# Patient Record
Sex: Male | Born: 1955 | Race: White | Hispanic: No | Marital: Married | Smoking: Never smoker
Health system: Southern US, Community
[De-identification: ages and names within clinical notes are randomized; demographics above are authoritative.]

## PROBLEM LIST (undated history)

## (undated) DIAGNOSIS — Z91018 Allergy to other foods: Secondary | ICD-10-CM

## (undated) DIAGNOSIS — R7989 Other specified abnormal findings of blood chemistry: Secondary | ICD-10-CM

## (undated) DIAGNOSIS — K8020 Calculus of gallbladder without cholecystitis without obstruction: Secondary | ICD-10-CM

## (undated) HISTORY — DX: Allergy to other foods: Z91.018

## (undated) HISTORY — DX: Calculus of gallbladder without cholecystitis without obstruction: K80.20

## (undated) HISTORY — PX: LIPOMA EXCISION: SHX5283

---

## 2001-04-10 HISTORY — PX: COLONOSCOPY: SHX174

## 2001-08-21 ENCOUNTER — Ambulatory Visit (HOSPITAL_COMMUNITY): Admission: RE | Admit: 2001-08-21 | Discharge: 2001-08-21 | Payer: Self-pay | Admitting: Internal Medicine

## 2001-12-13 ENCOUNTER — Ambulatory Visit (HOSPITAL_COMMUNITY): Admission: RE | Admit: 2001-12-13 | Discharge: 2001-12-13 | Payer: Self-pay | Admitting: General Surgery

## 2010-03-30 ENCOUNTER — Emergency Department (HOSPITAL_COMMUNITY)
Admission: EM | Admit: 2010-03-30 | Discharge: 2010-03-30 | Payer: Self-pay | Source: Home / Self Care | Admitting: Emergency Medicine

## 2010-06-20 LAB — CBC
MCH: 33.6 pg (ref 26.0–34.0)
MCHC: 38 g/dL — ABNORMAL HIGH (ref 30.0–36.0)
Platelets: 211 10*3/uL (ref 150–400)
RDW: 14 % (ref 11.5–15.5)

## 2010-06-20 LAB — POCT CARDIAC MARKERS
CKMB, poc: <1 ng/mL — ABNORMAL LOW (ref 1.0–8.0)
Myoglobin, poc: 78.4 ng/mL (ref 12–200)
Troponin i, poc: <0.05 ng/mL (ref 0.00–0.09)

## 2010-06-20 LAB — COMPREHENSIVE METABOLIC PANEL
AST: 27 U/L (ref 0–37)
Albumin: 4.3 g/dL (ref 3.5–5.2)
Calcium: 9.4 mg/dL (ref 8.4–10.5)
Creatinine, Ser: 0.99 mg/dL (ref 0.4–1.5)
GFR calc Af Amer: >60 mL/min (ref >60)
GFR calc non Af Amer: >60 mL/min (ref >60)

## 2010-06-20 LAB — LIPASE, BLOOD: Lipase: 35 U/L (ref 11–59)

## 2010-08-26 NOTE — Op Note (Signed)
[facility]  Patient:    [name], [name] Visit Number: [id number] MRN: [id number]          Service Type: END Location: DAY Attending Physician:  Jonathon Bellows Dictated by:   Roetta Sessions, M.D. Proc. Date: 08/21/01 Admit Date:  08/21/2001   CC:         Carylon Perches, M.D.   Operative Report  PROCEDURE:  Diagnostic colonoscopy.  INDICATIONS FOR PROCEDURE:  The patient is a 55 year old gentleman with intermittent diarrhea and recent hematochezia.  He has had some cramping left lower quadrant abdominal pain.  Colonoscopy is now being done to further evaluate his symptoms. The approach has been discussed with [name]. The potential risks, benefits, and alternatives have been reviewed and questions answered.  Please see my consultation note from August 06, 2001, for more information.  PROCEDURE NOTE:  O2 saturation, blood pressure, pulse, and respirations were monitored throughout the entire procedure.  Conscious sedation, Versed 3 mg IV and Demerol 75 mg IV in divided doses.  The patient received atropine 0.5 mg IV for asymptomatic bradycardia in the 50s prior to initiation of the procedure.  FINDINGS:  Digital rectal examination revealed no abnormalities.  ENDOSCOPIC FINDINGS:  The prep was good.  RECTUM:  Examination of the rectal mucosa including the retroflexed view of the anal verge and anal canal revealed only anal canal hemorrhoids.  COLON:  The colonic mucosa was surveyed from the rectosigmoid junction through the left transverse and right colon to the area of the appendiceal orifice, ileocecal valve, and cecum.  The patient had a few scattered pancolonic diverticula.  The remainder of the colonic mucosa appeared normal.  The cecum, ileocecal valve, and appendiceal orifice were photographed for the record. The terminal ileum was intubated a good 10 cm, and this segment of the GI tract also appeared normal.  The colonic mucosa appeared  normal except for scattered pancolonic diverticula from the level of the cecum and the ileocecal valve. The scope was slowly withdrawn.  All previously mentioned mucosal surfaces were again seen.  Some residue was suctioned for microbiology studies.  The patient tolerated the procedure well.  IMPRESSION: 1. Anal canal hemorrhage or otherwise normal rectum. 2. A few scattered pancolonic diverticula.  The remainder of the colonic    mucosa appeared normal. 3. Normal terminal ileum.  Stool sample obtained. 4. I suspect that the patient will turn out to have irritable bowel syndrome    with rectal bleeding secondary to hemorrhoids.  RECOMMENDATIONS: 1. Ten-day course of Anusol-HC suppositories, one per rectum at bedtime. 2. ______ sublingual one before meals and at bedtime p.r.n. abdominal    cramps and diarrhea. 3. Followup on stool studies. 4. Diverticulosis literature. 5. Further recommendations to follow. Dictated by:   Roetta Sessions, M.D. Attending Physician:  Jonathon Bellows DD:  08/21/01 TD:  08/22/01 Job: [id number]

## 2010-08-26 NOTE — Op Note (Signed)
   NAME:  [name], [name] NO.:  [id number]   MEDICAL RECORD NO.:  [id number]                   PATIENT TYPE:  AMB   LOCATION:  DAY                                  FACILITY:  [facility]   PHYSICIAN:  Dalia Heading, M.D.               DATE OF BIRTH:  03/30/1956   DATE OF PROCEDURE:  12/13/2001  DATE OF DISCHARGE:                                 OPERATIVE REPORT   PREOPERATIVE DIAGNOSIS:  Enlarging mass, posterior scalp.   POSTOPERATIVE DIAGNOSIS:  Enlarging mass, posterior scalp, benign.   PROCEDURE:  Excision of 4 cm mass, scalp.   SURGEON:  Dalia Heading, M.D.   ANESTHESIA:  General   INDICATIONS:  The patient is a 55 year old white male who presents with an  enlarging mass along the right posterior aspect of the scalp. The risks and  benefits of the procedure including bleeding, infection, and recurrence of  the mass were fully explained to the patient, who gave informed consent and  sedation   DESCRIPTION OF PROCEDURE:  The patient was placed in the left lateral  decubitus position after general anesthesia was administered.  The hair over  the mass was shaved and the area prepped with Betadine.   A transverse incision was made over the mass.  The incision was taken down  to the subcutaneous layer.  Any bleeding was controlled using Bovie  electrocautery.  A 4-5 cm lipoma was removed.  The wound was irrigated with  normal saline.  The subcutaneous layer was reapproximated using 4-0 Vicryl  interrupted suture.  The skin was closed using a 4-0 Nylon suture.  Bacitracin ointment was then applied.   All tape and needle counts were correct at the end of the procedure.  The  patient was awakened and transferred to PACU in stable condition.   COMPLICATIONS:  None.    SPECIMEN:  Lipoma, scalp.   BLOOD LOSS:  Minimal.                                                 Dalia Heading, M.D.    MAJ/MEDQ  D:  12/13/2001  T:  12/13/2001  Job:   [id number]   cc:   [name]. [name], M.D.

## 2010-08-26 NOTE — H&P (Signed)
   NAME:  [name], [name] NO.:  [id number]   MEDICAL RECORD NO.:  [id number]                   PATIENT TYPE:   LOCATION:                                       FACILITY:   PHYSICIAN:  Dalia Heading, M.D.               DATE OF BIRTH:  Mar 03, 1956   DATE OF ADMISSION:  DATE OF DISCHARGE:                                HISTORY & PHYSICAL   CHIEF COMPLAINT:  Mass, scalp, enlarging.   HISTORY OF PRESENT ILLNESS:  The patient is a 55 year old white male who is  referred for evaluation and treatment of a mass on the scalp.  It has been  present for several years, but recently has increased in size.  It is  occasionally tender with pressure when he wears a hard hat.  No drainage has  been noted.   PAST MEDICAL HISTORY:  Spastic colon.   PAST SURGICAL HISTORY:  Unremarkable.   CURRENT MEDICATIONS:  Levsin.   ALLERGIES:  NO KNOWN DRUG ALLERGIES.   REVIEW OF SYSTEMS:  Unremarkable.   PHYSICAL EXAMINATION:  GENERAL:  The patient is a well-developed, well-  nourished white male in no acute distress.  VITAL SIGNS:  He is afebrile and vital signs are stable.  HEENT:  Reveals a 4-5 cm ovoid, subcutaneous mass noted along the posterior  scalp.  No punctum is noted.  It is slightly tender to touch.  LUNGS:  Clear to auscultation with equal breath sounds bilaterally.  HEART:  Regular rate and rhythm without S3, S4, or murmurs.   IMPRESSION:  Enlarging subcutaneous mass, scalp.   PLAN:  The patient was scheduled for excision of the mass, scalp, on  December 17, 2001.  The risks and benefits of the procedure including  bleeding, infection, and recurrence of the mass were fully explained to the  patient, who gave informed consent.                                               Dalia Heading, M.D.   MAJ/MEDQ  D:  11/19/2001  T:  11/19/2001  Job:  [id number]   cc:   [name]. [name], M.D.

## 2012-01-03 ENCOUNTER — Telehealth: Payer: Self-pay

## 2012-01-03 NOTE — Telephone Encounter (Signed)
LM for pt to call and schedule.

## 2012-01-23 NOTE — Telephone Encounter (Signed)
LMOM to call.

## 2012-01-24 NOTE — Telephone Encounter (Signed)
Letter to pt and PCP.  

## 2012-03-17 ENCOUNTER — Encounter (HOSPITAL_COMMUNITY): Payer: Self-pay | Admitting: Emergency Medicine

## 2012-03-17 ENCOUNTER — Emergency Department (HOSPITAL_COMMUNITY): Payer: 59

## 2012-03-17 ENCOUNTER — Emergency Department (HOSPITAL_COMMUNITY)
Admission: EM | Admit: 2012-03-17 | Discharge: 2012-03-17 | Disposition: A | Discharge status: Home / Self Care | Payer: 59 | Attending: Emergency Medicine | Admitting: Emergency Medicine

## 2012-03-17 ENCOUNTER — Other Ambulatory Visit: Payer: Self-pay

## 2012-03-17 DIAGNOSIS — Z79899 Other long term (current) drug therapy: Secondary | ICD-10-CM | POA: Insufficient documentation

## 2012-03-17 DIAGNOSIS — R109 Unspecified abdominal pain: Secondary | ICD-10-CM | POA: Insufficient documentation

## 2012-03-17 DIAGNOSIS — E291 Testicular hypofunction: Secondary | ICD-10-CM | POA: Insufficient documentation

## 2012-03-17 HISTORY — DX: Other specified abnormal findings of blood chemistry: R79.89

## 2012-03-17 LAB — COMPREHENSIVE METABOLIC PANEL
ALT: 43 U/L (ref 0–53)
BUN: 24 mg/dL — ABNORMAL HIGH (ref 6–23)
CO2: 29 mEq/L (ref 19–32)
Calcium: 10 mg/dL (ref 8.4–10.5)
Chloride: 101 mEq/L (ref 96–112)
Creatinine, Ser: 0.97 mg/dL (ref 0.50–1.35)
GFR calc Af Amer: >90 mL/min (ref >90)
GFR calc non Af Amer: >90 mL/min (ref >90)
Potassium: 4.5 mEq/L (ref 3.5–5.1)

## 2012-03-17 LAB — CBC WITH DIFFERENTIAL/PLATELET
Basophils Relative: 0 % (ref 0–1)
Lymphocytes Relative: 20 % (ref 12–46)
MCV: 90.5 fL (ref 78.0–100.0)
Monocytes Relative: 8 % (ref 3–12)
Neutrophils Relative %: 70 % (ref 43–77)
Platelets: 208 10*3/uL (ref 150–400)
RBC: 5.03 MIL/uL (ref 4.22–5.81)

## 2012-03-17 LAB — TROPONIN I: Troponin I: <0.3 ng/mL (ref <0.30)

## 2012-03-17 LAB — LIPASE, BLOOD: Lipase: 46 U/L (ref 11–59)

## 2012-03-17 MED ORDER — MORPHINE SULFATE 4 MG/ML IJ SOLN
4.0000 mg | Freq: Once | INTRAMUSCULAR | Status: AC
  Administered 2012-03-17: 4 mg via INTRAVENOUS
  Filled 2012-03-17: qty 1

## 2012-03-17 MED ORDER — PANTOPRAZOLE SODIUM 20 MG PO TBEC: 20.0000 mg | DELAYED_RELEASE_TABLET | Freq: Every day | ORAL | Status: DC

## 2012-03-17 MED ORDER — GI COCKTAIL ~~LOC~~
30.0000 mL | Freq: Once | ORAL | Status: AC
  Administered 2012-03-17: 30 mL via ORAL
  Filled 2012-03-17: qty 30

## 2012-03-17 MED ORDER — ONDANSETRON HCL 4 MG/2ML IJ SOLN
4.0000 mg | Freq: Once | INTRAMUSCULAR | Status: AC
  Administered 2012-03-17: 4 mg via INTRAVENOUS
  Filled 2012-03-17: qty 2

## 2012-03-17 MED ORDER — PANTOPRAZOLE SODIUM 40 MG IV SOLR
40.0000 mg | Freq: Once | INTRAVENOUS | Status: AC
  Administered 2012-03-17: 40 mg via INTRAVENOUS
  Filled 2012-03-17: qty 40

## 2012-03-17 MED ORDER — LORAZEPAM 2 MG/ML IJ SOLN
1.0000 mg | Freq: Once | INTRAMUSCULAR | Status: AC
  Administered 2012-03-17: 1 mg via INTRAVENOUS
  Filled 2012-03-17: qty 1

## 2012-03-17 MED ORDER — HYDROMORPHONE HCL PF 1 MG/ML IJ SOLN
1.0000 mg | Freq: Once | INTRAMUSCULAR | Status: AC
  Administered 2012-03-17: 1 mg via INTRAVENOUS
  Filled 2012-03-17: qty 1

## 2012-03-17 MED ORDER — IOHEXOL 350 MG/ML SOLN
120.0000 mL | Freq: Once | INTRAVENOUS | Status: AC | PRN
  Administered 2012-03-17: 120 mL via INTRAVENOUS

## 2012-03-17 MED ORDER — HYDROCODONE-ACETAMINOPHEN 5-325 MG PO TABS: 1.0000 | ORAL_TABLET | Freq: Four times a day (QID) | ORAL | Status: DC | PRN

## 2012-03-17 NOTE — ED Provider Notes (Signed)
History     CSN: [id number]  Arrival date & time 03/17/12  0051   First MD Initiated Contact with Patient 03/17/12 0102      Chief Complaint  Patient presents with  . Chest Pain    (Consider location/radiation/quality/duration/timing/severity/associated sxs/prior treatment) Patient is a 56 y.o. male presenting with chest pain. The history is provided by the patient (the pt complains of upper abd pain). No language interpreter was used.  Chest Pain The chest pain began 3 - 5 hours ago. Chest pain occurs constantly. The chest pain is unchanged. Associated with: nothing. At its most intense, the pain is at 6/10. The pain is currently at 4/10. The severity of the pain is moderate. The quality of the pain is described as aching. Pertinent negatives for primary symptoms include no fatigue, no cough and no abdominal pain.  Pertinent negatives for past medical history include no seizures.     Past Medical History  Diagnosis Date  . Low testosterone     Past Surgical History  Procedure Date  . Lipoma excision     History reviewed. No pertinent family history.  History  Substance Use Topics  . Smoking status: Never Smoker   . Smokeless tobacco: Not on file  . Alcohol Use: No      Review of Systems  Constitutional: Negative for fatigue.  HENT: Negative for congestion, sinus pressure and ear discharge.   Eyes: Negative for discharge.  Respiratory: Negative for cough.   Cardiovascular: Positive for chest pain.  Gastrointestinal: Negative for abdominal pain and diarrhea.  Genitourinary: Negative for frequency and hematuria.  Musculoskeletal: Negative for back pain.  Skin: Negative for rash.  Neurological: Negative for seizures and headaches.  Hematological: Negative.   Psychiatric/Behavioral: Negative for hallucinations.    Allergies  Beef-derived products  Home Medications   Current Outpatient Rx  Name  Route  Sig  Dispense  Refill  . FLUTICASONE PROPIONATE 50  MCG/ACT NA SUSP   Nasal   Place 2 sprays into the nose daily.         . TESTOSTERONE 50 MG/5GM TD GEL   Transdermal   Place 5 g onto the skin daily.         Marland Kitchen HYDROCODONE-ACETAMINOPHEN 5-325 MG PO TABS   Oral   Take 1 tablet by mouth every 6 (six) hours as needed for pain.   20 tablet   0   . PANTOPRAZOLE SODIUM 20 MG PO TBEC   Oral   Take 1 tablet (20 mg total) by mouth daily.   30 tablet   0     BP 154/89  Pulse 59  Temp 98.4 F (36.9 C) (Oral)  Resp 20  Ht 5\' 11"  (1.803 m)  Wt 250 lb (113.399 kg)  BMI 34.87 kg/m2  SpO2 100%  Physical Exam  Constitutional: He is oriented to person, place, and time. He appears well-developed.  HENT:  Head: Normocephalic and atraumatic.  Eyes: Conjunctivae normal and EOM are normal. No scleral icterus.  Neck: Neck supple. No thyromegaly present.  Cardiovascular: Normal rate and regular rhythm.  Exam reveals no gallop and no friction rub.   No murmur heard. Pulmonary/Chest: No stridor. He has no wheezes. He has no rales. He exhibits no tenderness.  Abdominal: He exhibits no distension. There is tenderness. There is no rebound.  Musculoskeletal: Normal range of motion. He exhibits no edema.  Lymphadenopathy:    He has no cervical adenopathy.  Neurological: He is oriented to person, place, and time.  Coordination normal.  Skin: No rash noted. No erythema.  Psychiatric: He has a normal mood and affect. His behavior is normal.    ED Course  Procedures (including critical care time)  Labs Reviewed  COMPREHENSIVE METABOLIC PANEL - Abnormal; Notable for the following:    Glucose, Bld 149 (*)     BUN 24 (*)     Total Bilirubin 1.4 (*)     All other components within normal limits  CBC WITH DIFFERENTIAL  LIPASE, BLOOD  TROPONIN I  TROPONIN I   Ct Angio Chest Pe W/cm &/or Wo Cm  03/17/2012  *RADIOLOGY REPORT*  Clinical Data:  Chest pain, shortness of breath.  Question thoracic aneurysm.  CT CHEST, ABDOMEN AND PELVIS WITH  CONTRAST  Technique:  Multidetector CT imaging of the chest, abdomen and pelvis was performed following the standard protocol during bolus administration of intravenous contrast.  Contrast: OMNIPAQUE IOHEXOL 350 MG/ML SOLN  Comparison:   None.  CT CHEST  Findings:  Mild cardiomegaly.  Aorta is normal caliber.  No dissection.  No filling defects in the pulmonary arteries to suggest pulmonary emboli. No mediastinal, hilar, or axillary adenopathy.  Lungs are clear.  No focal airspace opacities or suspicious nodules.  No effusions. Visualized thyroid and chest wall soft tissues unremarkable.  IMPRESSION: No acute findings.  CT ABDOMEN AND PELVIS  Findings:  Aorta is normal caliber.  No dissection.  Mesenteric vessels and single renal arteries are widely patent.  Small layering gallstones in the gallbladder.  Liver, spleen, stomach, pancreas, adrenals and kidneys are normal.  Moderate descending colonic and sigmoid diverticulosis. Scattered right colonic and transverse colonic diverticula.  No active diverticulitis.  Urinary bladder and prostate grossly unremarkable. Small bowel is decompressed.  No free fluid, free air or adenopathy.  No acute bony abnormality.  IMPRESSION: No evidence of aortic aneurysm or dissection.  Cholelithiasis.   Original Report Authenticated By: Charlett Nose, M.D.    Dg Chest Portable 1 View  03/17/2012  *RADIOLOGY REPORT*  Clinical Data: Chest pain.  Vomiting.  PORTABLE CHEST - 1 VIEW  Comparison: 03/30/2010.  Findings: The cardiac silhouette remains mildly enlarged.  Clear lungs with normal vascularity.  Unremarkable bones.  IMPRESSION: Stable mild cardiomegaly.  No acute abnormality.   Original Report Authenticated By: Beckie Salts, M.D.    Ct Angio Abd/pel W/ And/or W/o  03/17/2012  *RADIOLOGY REPORT*  Clinical Data:  Chest pain, shortness of breath.  Question thoracic aneurysm.  CT CHEST, ABDOMEN AND PELVIS WITH CONTRAST  Technique:  Multidetector CT imaging of the chest, abdomen  and pelvis was performed following the standard protocol during bolus administration of intravenous contrast.  Contrast: OMNIPAQUE IOHEXOL 350 MG/ML SOLN  Comparison:   None.  CT CHEST  Findings:  Mild cardiomegaly.  Aorta is normal caliber.  No dissection.  No filling defects in the pulmonary arteries to suggest pulmonary emboli. No mediastinal, hilar, or axillary adenopathy.  Lungs are clear.  No focal airspace opacities or suspicious nodules.  No effusions. Visualized thyroid and chest wall soft tissues unremarkable.  IMPRESSION: No acute findings.  CT ABDOMEN AND PELVIS  Findings:  Aorta is normal caliber.  No dissection.  Mesenteric vessels and single renal arteries are widely patent.  Small layering gallstones in the gallbladder.  Liver, spleen, stomach, pancreas, adrenals and kidneys are normal.  Moderate descending colonic and sigmoid diverticulosis. Scattered right colonic and transverse colonic diverticula.  No active diverticulitis.  Urinary bladder and prostate grossly unremarkable. Small bowel is  decompressed.  No free fluid, free air or adenopathy.  No acute bony abnormality.  IMPRESSION: No evidence of aortic aneurysm or dissection.  Cholelithiasis.   Original Report Authenticated By: Charlett Nose, M.D.      1. Abdominal pain      Date: 03/17/2012  Rate: 60  Rhythm: normal sinus rhythm  QRS Axis: normal  Intervals: normal  ST/T Wave abnormalities: normal  Conduction Disutrbances:none  Narrative Interpretation:   Old EKG Reviewed: none available    MDM          Benny Lennert, MD 03/17/12 [phone]

## 2012-03-17 NOTE — ED Notes (Signed)
Patient complaining of worsening chest pain. Advised MD.

## 2012-03-17 NOTE — ED Notes (Signed)
Patient complaining of chest pain since 2130 yesterday worsening through the night. States "I broke out in a sweat and started vomiting."

## 2013-09-15 IMAGING — CT CT CTA ABD/PEL W/CM AND/OR W/O CM
2 of 7 series · 5 of 46 positions shown, 10 images · IV contrast (omnipaque)
Comparison: None.

CT CHEST

CLINICAL DATA: Chest pain, shortness of breath.  Question thoracic
aneurysm.

CT CHEST, ABDOMEN AND PELVIS WITH CONTRAST
TECHNIQUE: Multidetector CT imaging of the chest, abdomen and
pelvis was performed following the standard protocol during bolus
administration of intravenous contrast.
Contrast: 120mL OMNIPAQUE IOHEXOL 350 MG/ML SOLN

[Series 6: dissection 3.0 b40f · axial · 0.84mm/px · z∈[-528,-200]mm · 3 of 219 slices shown, 7 images]
[im 55/219  soft-tissue]
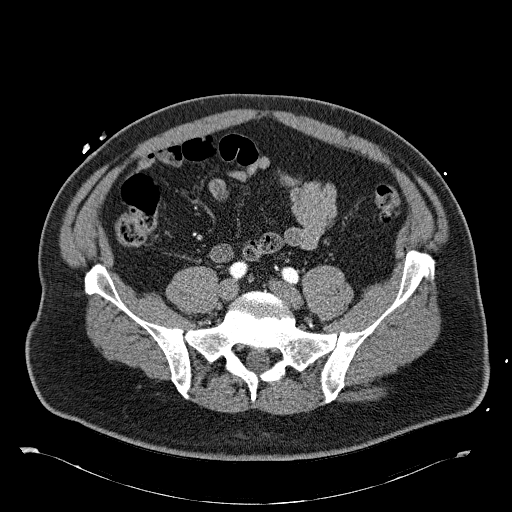
[im 55/219  lung]
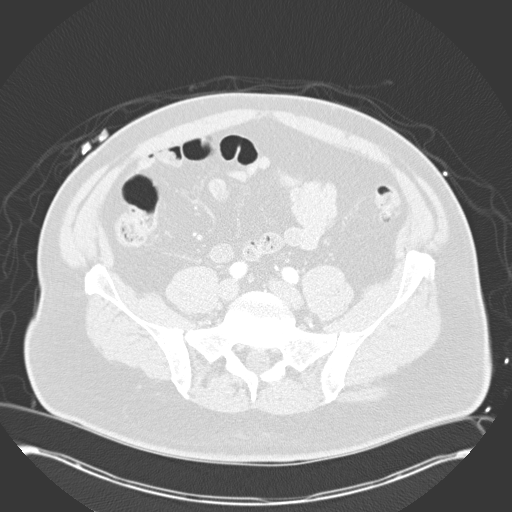
[im 55/219  bone]
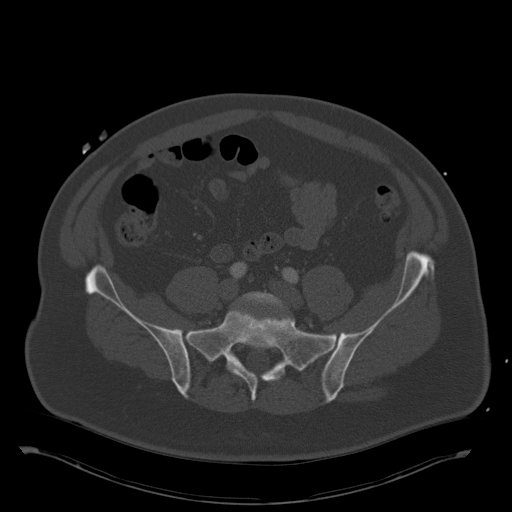
[im 110/219  soft-tissue]
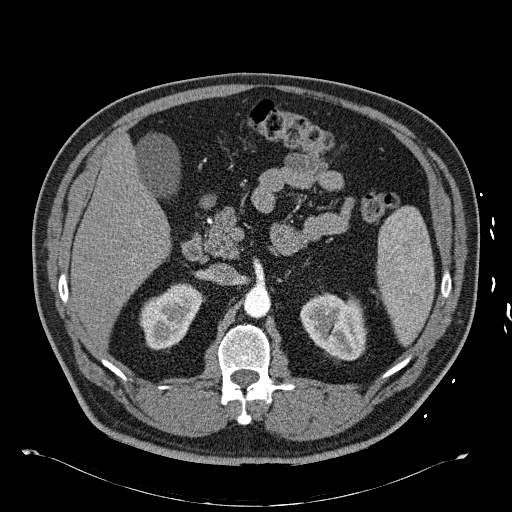
[im 110/219  lung]
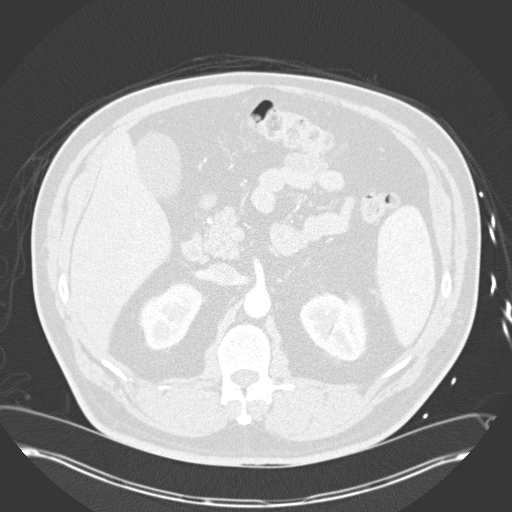
[im 164/219  soft-tissue]
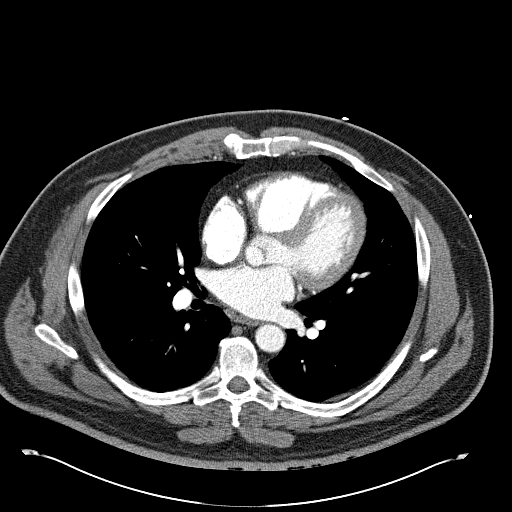
[im 164/219  lung]
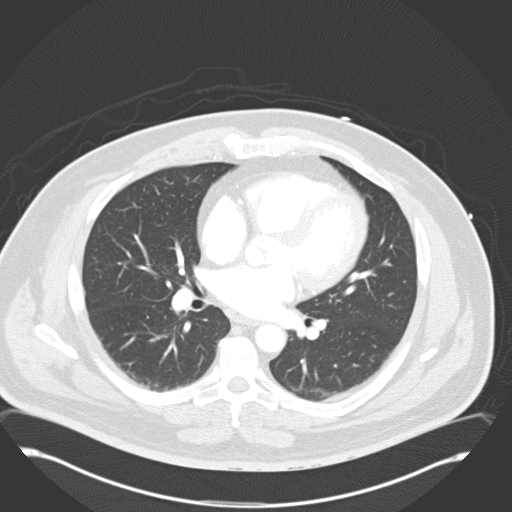

[Series 8: mpr cor post contrast · coronal · 0.84mm/px · 2 of 124 slices shown, 3 images]
[im 42/124  soft-tissue]
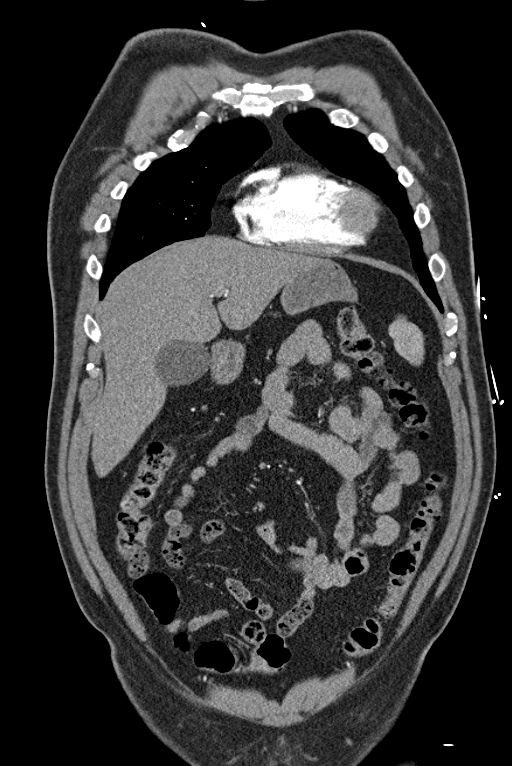
[im 42/124  bone]
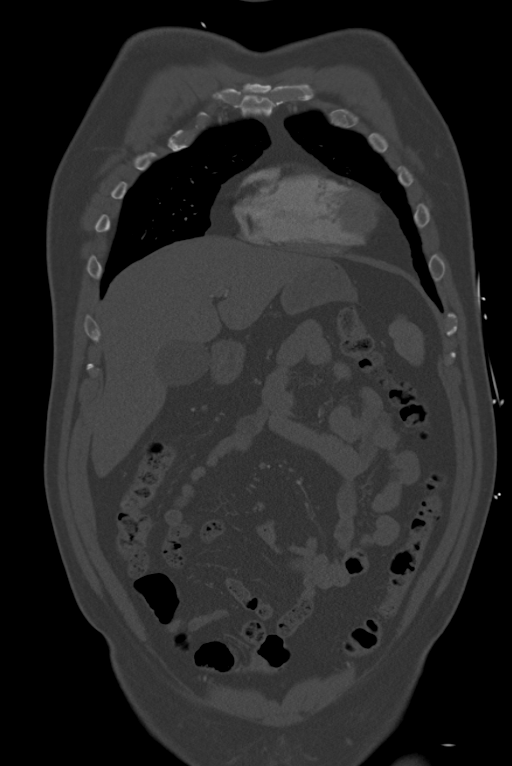
[im 83/124  soft-tissue]
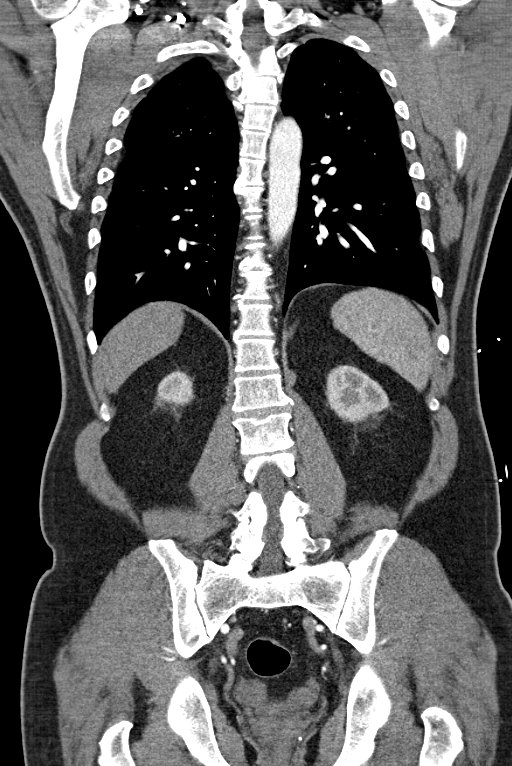

[5 of 46 positions shown; findings below may reference images not displayed]

FINDINGS: Mild cardiomegaly.  Aorta is normal caliber.  No
dissection.  No filling defects in the pulmonary arteries to
suggest pulmonary emboli. No mediastinal, hilar, or axillary
adenopathy.  Lungs are clear.  No focal airspace opacities or
suspicious nodules.  No effusions. Visualized thyroid and chest
wall soft tissues unremarkable.
IMPRESSION: No acute findings.

CT ABDOMEN AND PELVIS
FINDINGS: Aorta is normal caliber.  No dissection.  Mesenteric
vessels and single renal arteries are widely patent.

Small layering gallstones in the gallbladder.  Liver, spleen,
stomach, pancreas, adrenals and kidneys are normal.  Moderate
descending colonic and sigmoid diverticulosis. Scattered right
colonic and transverse colonic diverticula.  No active
diverticulitis.  Urinary bladder and prostate grossly unremarkable.
Small bowel is decompressed.  No free fluid, free air or
adenopathy.

No acute bony abnormality.
IMPRESSION: No evidence of aortic aneurysm or dissection.

Cholelithiasis.

## 2019-10-09 ENCOUNTER — Encounter: Payer: Self-pay | Admitting: Internal Medicine

## 2019-12-04 ENCOUNTER — Encounter: Payer: Self-pay | Admitting: Gastroenterology

## 2019-12-04 ENCOUNTER — Ambulatory Visit (INDEPENDENT_AMBULATORY_CARE_PROVIDER_SITE_OTHER): Payer: 59 | Admitting: Gastroenterology

## 2019-12-04 ENCOUNTER — Other Ambulatory Visit: Payer: Self-pay

## 2019-12-04 DIAGNOSIS — Z1211 Encounter for screening for malignant neoplasm of colon: Secondary | ICD-10-CM

## 2019-12-04 NOTE — Patient Instructions (Addendum)
We are arranging a colonoscopy in the near future with Dr. Jena Gauss.  Continue to eat a high fiber diet as you are doing. I have included a handout for this as well.  Please call if any recurrent pain!  It was a pleasure to see you today. I want to create trusting relationships with patients to provide genuine, compassionate, and quality care. I value your feedback. If you receive a survey regarding your visit,  I greatly appreciate you taking time to fill this out.   Gelene Mink, PhD, ANP-BC [facility]    Diverticulosis  Diverticulosis is a condition that develops when small pouches (diverticula) form in the wall of the large intestine (colon). The colon is where water is absorbed and stool (feces) is formed. The pouches form when the inside layer of the colon pushes through weak spots in the outer layers of the colon. You may have a few pouches or many of them. The pouches usually do not cause problems unless they become inflamed or infected. When this happens, the condition is called diverticulitis. What are the causes? The cause of this condition is not known. What increases the risk? The following factors may make you more likely to develop this condition:  Being older than age 38. Your risk for this condition increases with age. Diverticulosis is rare among people younger than age 10. By age 33, many people have it.  Eating a low-fiber diet.  Having frequent constipation.  Being overweight.  Not getting enough exercise.  Smoking.  Taking over-the-counter pain medicines, like aspirin and ibuprofen.  Having a family history of diverticulosis. What are the signs or symptoms? In most people, there are no symptoms of this condition. If you do have symptoms, they may include:  Bloating.  Cramps in the abdomen.  Constipation or diarrhea.  Pain in the lower left side of the abdomen. How is this diagnosed? Because diverticulosis usually has no symptoms, it  is most often diagnosed during an exam for other colon problems. The condition may be diagnosed by:  Using a flexible scope to examine the colon (colonoscopy).  Taking an X-ray of the colon after dye has been put into the colon (barium enema).  Having a CT scan. How is this treated? You may not need treatment for this condition. Your health care provider may recommend treatment to prevent problems. You may need treatment if you have symptoms or if you previously had diverticulitis. Treatment may include:  Eating a high-fiber diet.  Taking a fiber supplement.  Taking a live bacteria supplement (probiotic).  Taking medicine to relax your colon. Follow these instructions at home: Medicines  Take over-the-counter and prescription medicines only as told by your health care provider.  If told by your health care provider, take a fiber supplement or probiotic. Constipation prevention Your condition may cause constipation. To prevent or treat constipation, you may need to:  Drink enough fluid to keep your urine pale yellow.  Take over-the-counter or prescription medicines.  Eat foods that are high in fiber, such as beans, whole grains, and fresh fruits and vegetables.  Limit foods that are high in fat and processed sugars, such as fried or sweet foods.  General instructions  Try not to strain when you have a bowel movement.  Keep all follow-up visits as told by your health care provider. This is important. Contact a health care provider if you:  Have pain in your abdomen.  Have bloating.  Have cramps.  Have not had a  bowel movement in 3 days. Get help right away if:  Your pain gets worse.  Your bloating becomes very bad.  You have a fever or chills, and your symptoms suddenly get worse.  You vomit.  You have bowel movements that are bloody or black.  You have bleeding from your rectum. Summary  Diverticulosis is a condition that develops when small pouches  (diverticula) form in the wall of the large intestine (colon).  You may have a few pouches or many of them.  This condition is most often diagnosed during an exam for other colon problems.  Treatment may include increasing the fiber in your diet, taking supplements, or taking medicines. This information is not intended to replace advice given to you by your health care provider. Make sure you discuss any questions you have with your health care provider. Document Revised: 10/24/2018 Document Reviewed: 10/24/2018 Elsevier Patient Education  2020 [employer].

## 2019-12-04 NOTE — Progress Notes (Signed)
Cc'ed to pcp °

## 2019-12-04 NOTE — Progress Notes (Signed)
Primary Care Physician:  Carylon Perches, MD  Referring Physician: Dr. [name]  Primary Gastroenterologist:  Dr. Jena Gauss   Chief Complaint  Patient presents with  . Colonoscopy    hx lower left sided pain in Mar and June    HPI:   [name] is a 64 y.o. male presenting today at the request of Dr. [name] due to history of abdominal pain and also due for screening colonoscopy. He reports a colonoscopy in 2003 by Dr. Jena Gauss; procedure notes not available, but patient is an excellent historian and reports diverticulosis but no polyps.   Had LLQ abdominal pain intensely for one day in March, then tapered off a few days later. June happened again. Had eaten salsa the night before the June visit. Quit eating tomato-based salsa. A few other times a few years ago had a slight pressure in lower abdomen. Cut caffeine out. Changing diet. No further discomfort.   BM twice per day, no constipation. Feels productive. No rectal bleeding. No weight loss or lack of appetite. No GERD symptoms.   Processed foods, fast foods "set him off". Will have diarrhea if eating these items, so he avoids them.   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 Eats lots of vegetables.   Past Medical History:  Diagnosis Date  . 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 Cholelithiasis   . Low testosterone     Past Surgical History:  Procedure Laterality Date  . COLONOSCOPY  2003   Dr. Jena Gauss: unable to find procedure notes as was in 2003. ?diverticula  . LIPOMA EXCISION      Current Outpatient Medications  Medication Sig Dispense Refill  . EPINEPHrine (EPIPEN 2-PAK) 0.3 mg/0.3 mL IJ SOAJ injection Inject 0.3 mg into the muscle as needed for anaphylaxis.    . fluticasone (FLONASE) 50 MCG/ACT nasal spray Place 2 sprays into the nose daily.    . meloxicam (MOBIC) 15 MG tablet Take 15 mg by mouth as needed for pain. Takes 1/2 tablet as needed.     No current facility-administered medications for this visit.     Allergies as of 12/04/2019 - Review Complete 12/04/2019  Allergen Reaction Noted  . Beef-derived products  03/17/2012  . Pork-derived products  12/04/2019    Family History  Problem Relation Age of Onset  . Colon cancer Neg Hx   . Colon polyps Neg Hx     Social History   Socioeconomic History  . Marital status: Married    Spouse name: Not on file  . Number of children: Not on file  . Years of education: Not on file  . Highest education level: Not on file  Occupational History  . Not on file  Tobacco Use  . Smoking status: Never Smoker  . Smokeless tobacco: Never Used  Substance and Sexual Activity  . Alcohol use: No  . Drug use: No  . Sexual activity: Not on file  Other Topics Concern  . Not on file  Social History Narrative  . Not on file   Social Determinants of Health   Financial Resource Strain:   . Difficulty of Paying Living Expenses: Not on file  Food Insecurity:   . Worried About Programme researcher, broadcasting/film/video in the Last Year: Not on file  . Ran Out of Food in the Last Year: Not on file  Transportation Needs:   . Lack of Transportation (Medical): Not on file  . Lack of Transportation (Non-Medical): Not  on file  Physical Activity:   . Days of Exercise per Week: Not on file  . Minutes of Exercise per Session: Not on file  Stress:   . Feeling of Stress : Not on file  Social Connections:   . Frequency of Communication with Friends and Family: Not on file  . Frequency of Social Gatherings with Friends and Family: Not on file  . Attends Religious Services: Not on file  . Active Member of Clubs or Organizations: Not on file  . Attends Banker Meetings: Not on file  . Marital Status: Not on file  Intimate Partner Violence:   . Fear of Current or Ex-Partner: Not on file  . Emotionally Abused: Not on file  . Physically Abused: Not on file  . Sexually Abused: Not on file    Review of Systems: Gen: Denies any fever, chills, fatigue, weight loss,  lack of appetite.  CV: Denies chest pain, heart palpitations, peripheral edema, syncope.  Resp: Denies shortness of breath at rest or with exertion. Denies wheezing or cough.  GI: see HPI GU : Denies urinary burning, urinary frequency, urinary hesitancy MS: Denies joint pain, muscle weakness, cramps, or limitation of movement.  Derm: Denies rash, itching, dry skin Psych: Denies depression, anxiety, memory loss, and confusion Heme: Denies bruising, bleeding, and enlarged lymph nodes.  Physical Exam: BP (!) 149/84   Pulse 63   Temp (!) 97.5 F (36.4 C) (Oral)   Ht 6' (1.829 m)   Wt 248 lb 6.4 oz (112.7 kg)   BMI 33.69 kg/m  General:   Alert and oriented. Pleasant and cooperative. Well-nourished and well-developed.  Head:  Normocephalic and atraumatic. Eyes:  Without icterus, sclera clear and conjunctiva pink.  Ears:  Normal auditory acuity. Mouth:  Mask in place Lungs:  Clear to auscultation bilaterally. No wheezes, rales, or rhonchi. No distress.  Heart:  S1, S2 present without murmurs appreciated.  Abdomen:  +BS, soft, non-tender and non-distended. No HSM noted. No guarding or rebound. No masses appreciated.  Rectal:  Deferred  Msk:  Symmetrical without gross deformities. Normal posture. Extremities:  Without edema. Neurologic:  Alert and  oriented x4;  grossly normal neurologically. Skin:  Intact without significant lesions or rashes. Psych:  Alert and cooperative. Normal mood and affect.  ASSESSMENT: [name] is a 64 y.o. male presenting today with several episodes of LLQ abdominal pain earlier this year that was self-limiting, afebrile, and known diverticulosis on prior imaging and colonoscopy in 2003. Could very well have had mild diverticulitis episodes. Currently, he is asymptomatic, following high fiber diet, and no alarm signs/symptoms. He has adjusted his diet with good results. Due for average risk screening colonoscopy. No family history of colorectal cancer or  polyps.  Will pursue screening colonoscopy in the near future. He is to call if any further abdominal pain and will pursue imaging.    PLAN:  Proceed with TCS with Dr. Jena Gauss in near future: the risks, benefits, and alternatives have been discussed with the patient in detail. The patient states understanding and desires to proceed.  Continue high fiber diet  Call if recurrent abdominal pain  Gelene Mink, PhD, [facility]

## 2019-12-17 ENCOUNTER — Telehealth: Payer: Self-pay | Admitting: *Deleted

## 2019-12-17 NOTE — Telephone Encounter (Signed)
Called pt. He has been scheduled for TCS with Dr. Jena Gauss on 11/16 at 7:30am. Patient aware will need covid test prior. Advised will mail appt with procedure instructions. Confirmed mailing address.

## 2019-12-19 ENCOUNTER — Telehealth: Payer: Self-pay | Admitting: *Deleted

## 2019-12-19 NOTE — Telephone Encounter (Signed)
PA approved via [facility]. Auth# [id number] dates 02/24/2020-05/24/2020

## 2020-02-18 ENCOUNTER — Encounter: Payer: Self-pay | Admitting: *Deleted

## 2020-02-18 ENCOUNTER — Telehealth: Payer: Self-pay | Admitting: *Deleted

## 2020-02-18 NOTE — Telephone Encounter (Signed)
Called pt and informed him that there was a scheduling conflict for his procedure date 02/24/2020.  He was informed that we needed to reschedule it.  Offered 12/3 or 12/10 but pt decided to reschedule to after the first of the year.  Scheduled new procedure date for 04/21/2020 at 7:30 with arrival at 6:30.  Pt rescheduled Covid screening to 04/19/2020 at 8:00.  Pt made aware that I will mail out new prep instructions.  Notified Eber Jones in Endo by [employer].

## 2020-02-23 ENCOUNTER — Other Ambulatory Visit (HOSPITAL_COMMUNITY): Payer: 59

## 2020-04-19 ENCOUNTER — Other Ambulatory Visit (HOSPITAL_COMMUNITY)
Admission: RE | Admit: 2020-04-19 | Discharge: 2020-04-19 | Disposition: A | Discharge status: Home / Self Care | Payer: 59 | Source: Ambulatory Visit | Attending: Internal Medicine | Admitting: Internal Medicine

## 2020-04-19 ENCOUNTER — Other Ambulatory Visit: Payer: Self-pay

## 2020-04-19 DIAGNOSIS — Z01812 Encounter for preprocedural laboratory examination: Secondary | ICD-10-CM | POA: Diagnosis present

## 2020-04-19 DIAGNOSIS — Z20822 Contact with and (suspected) exposure to covid-19: Secondary | ICD-10-CM | POA: Diagnosis not present

## 2020-04-19 LAB — SARS CORONAVIRUS 2 (TAT 6-24 HRS): SARS Coronavirus 2: NEGATIVE

## 2020-04-21 ENCOUNTER — Encounter (HOSPITAL_COMMUNITY): Payer: Self-pay | Admitting: Internal Medicine

## 2020-04-21 ENCOUNTER — Other Ambulatory Visit: Payer: Self-pay

## 2020-04-21 ENCOUNTER — Ambulatory Visit (HOSPITAL_COMMUNITY)
Admission: RE | Admit: 2020-04-21 | Discharge: 2020-04-21 | Disposition: A | Discharge status: Home / Self Care | Payer: 59 | Attending: Internal Medicine | Admitting: Internal Medicine

## 2020-04-21 ENCOUNTER — Encounter (HOSPITAL_COMMUNITY)
Admission: RE | Disposition: A | Discharge status: Home / Self Care | Payer: Self-pay | Source: Home / Self Care | Attending: Internal Medicine

## 2020-04-21 DIAGNOSIS — K635 Polyp of colon: Secondary | ICD-10-CM | POA: Diagnosis not present

## 2020-04-21 DIAGNOSIS — Z791 Long term (current) use of non-steroidal anti-inflammatories (NSAID): Secondary | ICD-10-CM | POA: Insufficient documentation

## 2020-04-21 DIAGNOSIS — Z91014 Allergy to mammalian meats: Secondary | ICD-10-CM | POA: Insufficient documentation

## 2020-04-21 DIAGNOSIS — K5730 Diverticulosis of large intestine without perforation or abscess without bleeding: Secondary | ICD-10-CM | POA: Insufficient documentation

## 2020-04-21 DIAGNOSIS — Z1211 Encounter for screening for malignant neoplasm of colon: Secondary | ICD-10-CM | POA: Insufficient documentation

## 2020-04-21 DIAGNOSIS — Z79899 Other long term (current) drug therapy: Secondary | ICD-10-CM | POA: Insufficient documentation

## 2020-04-21 HISTORY — PX: COLONOSCOPY: SHX5424

## 2020-04-21 HISTORY — PX: POLYPECTOMY: SHX149

## 2020-04-21 SURGERY — COLONOSCOPY
Anesthesia: Moderate Sedation

## 2020-04-21 MED ORDER — SODIUM CHLORIDE 0.9 % IV SOLN: INTRAVENOUS | Status: DC

## 2020-04-21 MED ORDER — ATROPINE SULFATE 1 MG/ML IJ SOLN
INTRAMUSCULAR | Status: AC
  Filled 2020-04-21: qty 1

## 2020-04-21 MED ORDER — ONDANSETRON HCL 4 MG/2ML IJ SOLN
INTRAMUSCULAR | Status: AC
  Filled 2020-04-21: qty 2

## 2020-04-21 MED ORDER — STERILE WATER FOR IRRIGATION IR SOLN
Status: DC | PRN
  Administered 2020-04-21: 100 mL

## 2020-04-21 MED ORDER — ONDANSETRON HCL 4 MG/2ML IJ SOLN
INTRAMUSCULAR | Status: DC | PRN
  Administered 2020-04-21: 4 mg via INTRAVENOUS

## 2020-04-21 MED ORDER — MEPERIDINE HCL 100 MG/ML IJ SOLN
INTRAMUSCULAR | Status: DC | PRN
Start: 2020-04-21 — End: 2020-04-21
  Administered 2020-04-21: 10 mg
  Administered 2020-04-21: 40 mg

## 2020-04-21 MED ORDER — FLUMAZENIL 0.5 MG/5ML IV SOLN
INTRAVENOUS | Status: AC
  Filled 2020-04-21: qty 5

## 2020-04-21 MED ORDER — MEPERIDINE HCL 50 MG/ML IJ SOLN
INTRAMUSCULAR | Status: AC
  Filled 2020-04-21: qty 1

## 2020-04-21 MED ORDER — MIDAZOLAM HCL 5 MG/5ML IJ SOLN
INTRAMUSCULAR | Status: AC
  Filled 2020-04-21: qty 10

## 2020-04-21 MED ORDER — NALOXONE HCL 0.4 MG/ML IJ SOLN
INTRAMUSCULAR | Status: AC
  Filled 2020-04-21: qty 1

## 2020-04-21 MED ORDER — EPINEPHRINE 1 MG/10ML IJ SOSY
PREFILLED_SYRINGE | INTRAMUSCULAR | Status: AC
  Filled 2020-04-21: qty 10

## 2020-04-21 MED ORDER — MIDAZOLAM HCL 5 MG/5ML IJ SOLN
INTRAMUSCULAR | Status: DC | PRN
  Administered 2020-04-21: 2 mg via INTRAVENOUS
  Administered 2020-04-21: 1 mg via INTRAVENOUS
  Administered 2020-04-21 (×3): 2 mg via INTRAVENOUS

## 2020-04-21 NOTE — Discharge Instructions (Signed)
Diverticulosis  Diverticulosis is a condition that develops when small pouches (diverticula) form in the wall of the large intestine (colon). The colon is where water is absorbed and stool (feces) is formed. The pouches form when the inside layer of the colon pushes through weak spots in the outer layers of the colon. You may have a few pouches or many of them. The pouches usually do not cause problems unless they become inflamed or infected. When this happens, the condition is called diverticulitis. What are the causes? The cause of this condition is not known. What increases the risk? The following factors may make you more likely to develop this condition:  Being older than age 64. Your risk for this condition increases with age. Diverticulosis is rare among people younger than age 30. By age 80, many people have it.  Eating a low-fiber diet.  Having frequent constipation.  Being overweight.  Not getting enough exercise.  Smoking.  Taking over-the-counter pain medicines, like aspirin and ibuprofen.  Having a family history of diverticulosis. What are the signs or symptoms? In most people, there are no symptoms of this condition. If you do have symptoms, they may include:  Bloating.  Cramps in the abdomen.  Constipation or diarrhea.  Pain in the lower left side of the abdomen. How is this diagnosed? Because diverticulosis usually has no symptoms, it is most often diagnosed during an exam for other colon problems. The condition may be diagnosed by:  Using a flexible scope to examine the colon (colonoscopy).  Taking an X-ray of the colon after dye has been put into the colon (barium enema).  Having a CT scan. How is this treated? You may not need treatment for this condition. Your health care provider may recommend treatment to prevent problems. You may need treatment if you have symptoms or if you previously had diverticulitis. Treatment may include:  Eating a high-fiber  diet.  Taking a fiber supplement.  Taking a live bacteria supplement (probiotic).  Taking medicine to relax your colon.   Follow these instructions at home: Medicines  Take over-the-counter and prescription medicines only as told by your health care provider.  If told by your health care provider, take a fiber supplement or probiotic. Constipation prevention Your condition may cause constipation. To prevent or treat constipation, you may need to:  Drink enough fluid to keep your urine pale yellow.  Take over-the-counter or prescription medicines.  Eat foods that are high in fiber, such as beans, whole grains, and fresh fruits and vegetables.  Limit foods that are high in fat and processed sugars, such as fried or sweet foods.   General instructions  Try not to strain when you have a bowel movement.  Keep all follow-up visits as told by your health care provider. This is important. Contact a health care provider if you:  Have pain in your abdomen.  Have bloating.  Have cramps.  Have not had a bowel movement in 3 days. Get help right away if:  Your pain gets worse.  Your bloating becomes very bad.  You have a fever or chills, and your symptoms suddenly get worse.  You vomit.  You have bowel movements that are bloody or black.  You have bleeding from your rectum. Summary  Diverticulosis is a condition that develops when small pouches (diverticula) form in the wall of the large intestine (colon).  You may have a few pouches or many of them.  This condition is most often diagnosed during an exam   for other colon problems.  Treatment may include increasing the fiber in your diet, taking supplements, or taking medicines. This information is not intended to replace advice given to you by your health care provider. Make sure you discuss any questions you have with your health care provider. Document Revised: 10/24/2018 Document Reviewed: 10/24/2018 Elsevier Patient  Education  [place]. Colon Polyps  Colon polyps are tissue growths inside the colon, which is part of the large intestine. They are one of the types of polyps that can grow in the body. A polyp may be a round bump or a mushroom-shaped growth. You could have one polyp or more than one. Most colon polyps are noncancerous (benign). However, some colon polyps can become cancerous over time. Finding and removing the polyps early can help prevent this. What are the causes? The exact cause of colon polyps is not known. What increases the risk? The following factors may make you more likely to develop this condition:  Having a family history of colorectal cancer or colon polyps.  Being older than 64 years of age.  Being younger than 64 years of age and having a significant family history of colorectal cancer or colon polyps or a genetic condition that puts you at higher risk of getting colon polyps.  Having inflammatory bowel disease, such as ulcerative colitis or Crohn's disease.  Having certain conditions passed from parent to child (hereditary conditions), such as: ? Familial adenomatous polyposis (FAP). ? Lynch syndrome. ? Turcot syndrome. ? Peutz-Jeghers syndrome. ? MUTYH-associated polyposis (MAP).  Being overweight.  Certain lifestyle factors. These include smoking cigarettes, drinking too much alcohol, not getting enough exercise, and eating a diet that is high in fat and red meat and low in fiber.  Having had childhood cancer that was treated with radiation of the abdomen. What are the signs or symptoms? Many times, there are no symptoms. If you have symptoms, they may include:  Blood coming from the rectum during a bowel movement.  Blood in the stool (feces). The blood may be bright red or very dark in color.  Pain in the abdomen.  A change in bowel habits, such as constipation or diarrhea. How is this diagnosed? This condition is diagnosed with a colonoscopy.  This is a procedure in which a lighted, flexible scope is inserted into the opening between the buttocks (anus) and then passed into the colon to examine the area. Polyps are sometimes found when a colonoscopy is done as part of routine cancer screening tests. How is this treated? This condition is treated by removing any polyps that are found. Most polyps can be removed during a colonoscopy. Those polyps will then be tested for cancer. Additional treatment may be needed depending on the results of testing. Follow these instructions at home: Eating and drinking  Eat foods that are high in fiber, such as fruits, vegetables, and whole grains.  Eat foods that are high in calcium and vitamin D, such as milk, cheese, yogurt, eggs, liver, fish, and broccoli.  Limit foods that are high in fat, such as fried foods and desserts.  Limit the amount of red meat, precooked or cured meat, or other processed meat that you eat, such as hot dogs, sausages, bacon, or meat loaves.  Limit sugary drinks.   Lifestyle  Maintain a healthy weight, or lose weight if recommended by your health care provider.  Exercise every day or as told by your health care provider.  Do not use any products that contain  nicotine or tobacco, such as cigarettes, e-cigarettes, and chewing tobacco. If you need help quitting, ask your health care provider.  Do not drink alcohol if: ? Your health care provider tells you not to drink. ? You are pregnant, may be pregnant, or are planning to become pregnant.  If you drink alcohol: ? Limit how much you use to:  0-1 drink a day for women.  0-2 drinks a day for men. ? Know how much alcohol is in your drink. In the [place] one drink equals one 12 oz bottle of beer (355 mL), one 5 oz glass of wine (148 mL), or one 1 oz glass of hard liquor (44 mL). General instructions  Take over-the-counter and prescription medicines only as told by your health care provider.  Keep all follow-up  visits. This is important. This includes having regularly scheduled colonoscopies. Talk to your health care provider about when you need a colonoscopy. Contact a health care provider if:  You have new or worsening bleeding during a bowel movement.  You have new or increased blood in your stool.  You have a change in bowel habits.  You lose weight for no known reason. Summary  Colon polyps are tissue growths inside the colon, which is part of the large intestine. They are one type of polyp that can grow in the body.  Most colon polyps are noncancerous (benign), but some can become cancerous over time.  This condition is diagnosed with a colonoscopy.  This condition is treated by removing any polyps that are found. Most polyps can be removed during a colonoscopy. This information is not intended to replace advice given to you by your health care provider. Make sure you discuss any questions you have with your health care provider. Document Revised: 07/16/2019 Document Reviewed: 07/16/2019 Elsevier Patient Education  2021 [place].  Colonoscopy Discharge Instructions  Read the instructions outlined below and refer to this sheet in the next few weeks. These discharge instructions provide you with general information on caring for yourself after you leave the hospital. Your doctor may also give you specific instructions. While your treatment has been planned according to the most current medical practices available, unavoidable complications occasionally occur. If you have any problems or questions after discharge, call Dr. Gala Romney at [phone]. ACTIVITY  You may resume your regular activity, but move at a slower pace for the next 24 hours.   Take frequent rest periods for the next 24 hours.   Walking will help get rid of the air and reduce the bloated feeling in your belly (abdomen).   No driving for 24 hours (because of the medicine (anesthesia) used during the test).    Do not  sign any important legal documents or operate any machinery for 24 hours (because of the anesthesia used during the test).  NUTRITION  Drink plenty of fluids.   You may resume your normal diet as instructed by your doctor.   Begin with a light meal and progress to your normal diet. Heavy or fried foods are harder to digest and may make you feel sick to your stomach (nauseated).   Avoid alcoholic beverages for 24 hours or as instructed.  MEDICATIONS  You may resume your normal medications unless your doctor tells you otherwise.  WHAT YOU CAN EXPECT TODAY  Some feelings of bloating in the abdomen.   Passage of more gas than usual.   Spotting of blood in your stool or on the toilet paper.  IF YOU HAD POLYPS  REMOVED DURING THE COLONOSCOPY:  No aspirin products for 7 days or as instructed.   No alcohol for 7 days or as instructed.   Eat a soft diet for the next 24 hours.  FINDING OUT THE RESULTS OF YOUR TEST Not all test results are available during your visit. If your test results are not back during the visit, make an appointment with your caregiver to find out the results. Do not assume everything is normal if you have not heard from your caregiver or the medical facility. It is important for you to follow up on all of your test results.  SEEK IMMEDIATE MEDICAL ATTENTION IF:  You have more than a spotting of blood in your stool.   Your belly is swollen (abdominal distention).   You are nauseated or vomiting.   You have a temperature over 101.   You have abdominal pain or discomfort that is severe or gets worse throughout the day.   Diverticulosis and colon polyp information provided  1 polyp removed from your colon today  Further recommendations to follow pending review of pathology report  At patient request, I called [name] at [phone] -got voicemail.  Left a message.

## 2020-04-21 NOTE — Op Note (Signed)
[name] Patient Name: [name] Procedure Date: 04/21/2020 7:11 AM MRN: [id number] Date of Birth: 08/29/1955 Attending MD: Gennette Pacobert Michael Aylee Littrell , MD CSN: [id number] Age: [age] Admit Type: Outpatient Procedure:                Colonoscopy Indications:              Screening for colorectal malignant neoplasm Providers:                Gennette Pacobert Michael Rashad Obeid, MD, Crystal Page, Pandora LeiterNeville                            David, Technician Referring MD:              Medicines:                Midazolam 9 mg IV, Meperidine 50 mg IV Complications:            No immediate complications. Estimated Blood Loss:     Estimated blood loss was minimal. Procedure:                Pre-Anesthesia Assessment:                           - Prior to the procedure, a History and Physical                            was performed, and patient medications and                            allergies were reviewed. The patient's tolerance of                            previous anesthesia was also reviewed. The risks                            and benefits of the procedure and the sedation                            options and risks were discussed with the patient.                            All questions were answered, and informed consent                            was obtained. Prior Anticoagulants: The patient has                            taken no previous anticoagulant or antiplatelet                            agents. ASA Grade Assessment: II - A patient with                            mild systemic disease. After reviewing the risks  and benefits, the patient was deemed in                            satisfactory condition to undergo the procedure.                           After obtaining informed consent, the colonoscope                            was passed under direct vision. Throughout the                            procedure, the patient's blood pressure, pulse, and                             oxygen saturations were monitored continuously. The                            CF-[id number] ([id number]) scope was introduced through                            the anus and advanced to the the cecum, identified                            by appendiceal orifice and ileocecal valve. The                            colonoscopy was performed without difficulty. The                            patient tolerated the procedure well. The quality                            of the bowel preparation was adequate. The                            ileocecal valve, appendiceal orifice, and rectum                            were photographed. Scope In: 7:53:29 AM Scope Out: 8:06:46 AM Scope Withdrawal Time: 0 hours 8 minutes 34 seconds  Total Procedure Duration: 0 hours 13 minutes 17 seconds  Findings:      The perianal and digital rectal examinations were normal.      Multiple small and large-mouthed diverticula were found in the entire       colon.      A 4 mm polyp was found in the splenic flexure. The polyp was sessile.       The polyp was removed with a cold snare. Resection and retrieval were       complete. Estimated blood loss was minimal.      The exam was otherwise without abnormality on direct and retroflexion       views. Impression:               - Diverticulosis in the entire examined colon.                           -  One 4 mm polyp at the splenic flexure, removed                            with a cold snare. Resected and retrieved.                           - The examination was otherwise normal on direct                            and retroflexion views. Moderate Sedation:      Moderate (conscious) sedation was administered by the endoscopy nurse       and supervised by the endoscopist. The following parameters were       monitored: oxygen saturation, heart rate, blood pressure, and response       to care. Total physician intraservice time was 20 minutes. Recommendation:           -  Patient has a contact number available for                            emergencies. The signs and symptoms of potential                            delayed complications were discussed with the                            patient. Return to normal activities tomorrow.                            Written discharge instructions were provided to the                            patient.                           - Resume previous diet.                           - Continue present medications.                           - Repeat colonoscopy in 5 years for surveillance                            based on pathology results.                           - Return to GI office (date not yet determined). Procedure Code(s):        --- Professional ---                           [phone], Colonoscopy, flexible; with removal of                            tumor(s), polyp(s), or other lesion(s) by snare  technique                           G0500, Moderate sedation services provided by the                            same physician or other qualified health care                            professional performing a gastrointestinal                            endoscopic service that sedation supports,                            requiring the presence of an independent trained                            observer to assist in the monitoring of the                            patient's level of consciousness and physiological                            status; initial 15 minutes of intra-service time;                            patient age 64 years or older (additional time may                            be reported with [id number], as appropriate) Diagnosis Code(s):        --- Professional ---                           Z12.11, Encounter for screening for malignant                            neoplasm of colon                           K63.5, Polyp of colon                           K57.30, Diverticulosis  of large intestine without                            perforation or abscess without bleeding CPT copyright 2019 [employer]. All rights reserved. The codes documented in this report are preliminary and upon coder review may  be revised to meet current compliance requirements. Gerrit Friends. Charlie Seda, MD Gennette Pac, MD 04/21/2020 8:18:54 AM This report has been signed electronically. Number of Addenda: 0

## 2020-04-21 NOTE — H&P (Signed)
@LOGO @   Primary Care Physician:  , MD Primary Gastroenterologist:  Dr. Carylon Perches  Pre-Procedure History & Physical: HPI:  [name] is a 65 y.o. male is here for a screening colonoscopy.  No bowel symptoms currently.  Negative colonoscopy (diverticulosis) reportedly back in 2003.  No family history of colon cancer.  Past Medical History:  Diagnosis Date  . 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 Cholelithiasis   . Low testosterone     Past Surgical History:  Procedure Laterality Date  . COLONOSCOPY  2003   Dr. 2004: unable to find procedure notes as was in 2003. ?diverticula  . LIPOMA EXCISION      Prior to Admission medications   Medication Sig Start Date End Date Taking? Authorizing Provider  acetaminophen (TYLENOL) 500 MG tablet Take 500-1,000 mg by mouth every 6 (six) hours as needed (pain/headaches.).   Yes [provider]  diphenhydrAMINE (BENADRYL) 25 MG tablet Take 25 mg by mouth at bedtime as needed for sleep.   Yes [provider]  fluticasone (FLONASE) 50 MCG/ACT nasal spray Place 2 sprays into the nose daily as needed (allergies.).   Yes [provider]  EPINEPHrine 0.3 mg/0.3 mL IJ SOAJ injection Inject 0.3 mg into the muscle as needed for anaphylaxis.    [provider]  meloxicam (MOBIC) 15 MG tablet Take 7.5 mg by mouth daily as needed for pain.    [provider]  metaxalone (SKELAXIN) 800 MG tablet Take 800 mg by mouth 3 (three) times daily as needed (back tension/spasm.). 12/05/19   [provider]  triamcinolone (KENALOG) 0.1 % Apply 1 application topically 2 (two) times daily as needed (insect bites/skin irritation.). 12/05/19   [provider]    Allergies as of 12/17/2019 - Review Complete 12/04/2019  Allergen Reaction Noted  . Beef-derived products  03/17/2012  . Pork-derived products  12/04/2019    Family History  Problem Relation Age of Onset  . Colon cancer Neg Hx   . Colon  polyps Neg Hx     Social History   Socioeconomic History  . Marital status: Married    Spouse name: Not on file  . Number of children: Not on file  . Years of education: Not on file  . Highest education level: Not on file  Occupational History  . Not on file  Tobacco Use  . Smoking status: Never Smoker  . Smokeless tobacco: Never Used  Vaping Use  . Vaping Use: Never used  Substance and Sexual Activity  . Alcohol use: No  . Drug use: No  . Sexual activity: Not on file  Other Topics Concern  . Not on file  Social History Narrative  . Not on file   Social Determinants of Health   Financial Resource Strain: Not on file  Food Insecurity: Not on file  Transportation Needs: Not on file  Physical Activity: Not on file  Stress: Not on file  Social Connections: Not on file  Intimate Partner Violence: Not on file    Review of Systems: See HPI, otherwise negative ROS  Physical Exam: BP (!) 148/83   Pulse 61   Temp 98.2 F (36.8 C) (Oral)   Resp 13   Ht 6' (1.829 m)   Wt 113.4 kg   SpO2 99%   BMI 33.91 kg/m  General:   Alert,  Well-developed, well-nourished, pleasant and cooperative in NAD Lungs:  Clear throughout to auscultation.   No wheezes, crackles, or  rhonchi. No acute distress. Heart:  Regular rate and rhythm; no murmurs, clicks, rubs,  or gallops. Abdomen:  Soft, nontender and nondistended. No masses, hepatosplenomegaly or hernias noted. Normal bowel sounds, without guarding, and without rebound.    Impression/Plan: [name] is now here to undergo a screening colonoscopy.  Average risk colonoscopy.  Risks, benefits, limitations, imponderables and alternatives regarding colonoscopy have been reviewed with the patient. Questions have been answered. All parties agreeable.     Notice:  This dictation was prepared with Dragon dictation along with smaller phrase technology. Any transcriptional errors that result from this process are unintentional and may not  be corrected upon review.

## 2020-04-23 ENCOUNTER — Encounter (HOSPITAL_COMMUNITY): Payer: Self-pay | Admitting: Internal Medicine

## 2020-04-23 ENCOUNTER — Encounter: Payer: Self-pay | Admitting: Internal Medicine

## 2020-04-23 LAB — SURGICAL PATHOLOGY

## 2020-07-02 ENCOUNTER — Other Ambulatory Visit: Payer: Self-pay

## 2020-07-02 ENCOUNTER — Emergency Department (HOSPITAL_COMMUNITY): Payer: 59

## 2020-07-02 ENCOUNTER — Emergency Department (HOSPITAL_COMMUNITY)
Admission: EM | Admit: 2020-07-02 | Discharge: 2020-07-02 | Disposition: A | Discharge status: Home / Self Care | Payer: 59 | Attending: Emergency Medicine | Admitting: Emergency Medicine

## 2020-07-02 ENCOUNTER — Encounter (HOSPITAL_COMMUNITY): Payer: Self-pay | Admitting: Emergency Medicine

## 2020-07-02 DIAGNOSIS — W548XXA Other contact with dog, initial encounter: Secondary | ICD-10-CM | POA: Diagnosis not present

## 2020-07-02 DIAGNOSIS — S61452A Open bite of left hand, initial encounter: Secondary | ICD-10-CM | POA: Diagnosis not present

## 2020-07-02 DIAGNOSIS — S61313A Laceration without foreign body of left middle finger with damage to nail, initial encounter: Secondary | ICD-10-CM | POA: Insufficient documentation

## 2020-07-02 DIAGNOSIS — W540XXA Bitten by dog, initial encounter: Secondary | ICD-10-CM

## 2020-07-02 DIAGNOSIS — S6992XA Unspecified injury of left wrist, hand and finger(s), initial encounter: Secondary | ICD-10-CM | POA: Diagnosis present

## 2020-07-02 MED ORDER — CLINDAMYCIN HCL 300 MG PO CAPS: 300.0000 mg | ORAL_CAPSULE | Freq: Three times a day (TID) | ORAL | 0 refills | Status: AC

## 2020-07-02 MED ORDER — LIDOCAINE HCL (PF) 1 % IJ SOLN
10.0000 mL | Freq: Once | INTRAMUSCULAR | Status: AC
  Administered 2020-07-02: 10 mL via INTRADERMAL
  Filled 2020-07-02: qty 30

## 2020-07-02 MED ORDER — DOXYCYCLINE HYCLATE 100 MG PO CAPS: 100.0000 mg | ORAL_CAPSULE | Freq: Two times a day (BID) | ORAL | 0 refills | Status: DC

## 2020-07-02 NOTE — ED Provider Notes (Signed)
..  Laceration Repair  Date/Time: 07/02/2020 9:31 AM Performed by: Maxwell Caul, PA-C Authorized by: Maxwell Caul, PA-C   Consent:    Consent obtained:  Verbal   Consent given by:  Patient   Risks discussed:  Infection, need for additional repair, pain, poor cosmetic result and poor wound healing   Alternatives discussed:  No treatment and delayed treatment Universal protocol:    Procedure explained and questions answered to patient or proxy's satisfaction: yes     Relevant documents present and verified: yes     Test results available: yes     Imaging studies available: yes     Required blood products, implants, devices, and special equipment available: yes     Site/side marked: yes     Immediately prior to procedure, a time out was called: yes     Patient identity confirmed:  Verbally with patient Anesthesia:    Anesthesia method:  Local infiltration   Local anesthetic:  Lidocaine 1% w/o epi Laceration details:    Location:  Finger   Finger location:  L long finger   Length (cm):  2 Pre-procedure details:    Preparation:  Patient was prepped and draped in usual sterile fashion Exploration:    Hemostasis achieved with:  Direct pressure   Wound exploration: wound explored through full range of motion     Wound extent: no foreign bodies/material noted   Treatment:    Area cleansed with:  Povidone-iodine and saline   Amount of cleaning:  Extensive   Irrigation solution:  Sterile saline   Irrigation method:  Syringe   Visualized foreign bodies/material removed: no   Skin repair:    Repair method:  Sutures   Suture size:  6-0   Suture material:  Prolene   Suture technique:  Simple interrupted   Number of sutures:  3 Approximation:    Approximation:  Loose Repair type:    Repair type:  Simple Post-procedure details:    Dressing:  Antibiotic ointment and non-adherent dressing   Procedure completion:  Tolerated Comments:     Once the wound was anesthestized, it  was thoroughly and extensively irrigated. No visible foreign bodies noted. The wound on the dorsal aspect of the left 3rd digit was somewhat zig zag in appearance. The area was loosely closed with 3 sutures.       Maxwell Caul, PA-C 07/02/20 [id number]    Vanetta Mulders, MD 07/03/20 [phone]

## 2020-07-02 NOTE — ED Triage Notes (Signed)
Pt was taking his dog out this morning. Was trying to get rabbit out of dog mouth, dog bit his left middle finger. Bleeding controlled. Dog is updated on all shots

## 2020-07-02 NOTE — Discharge Instructions (Addendum)
Keep finger dressing on and in place for 48 hours.  Return for any signs of infection.  Suture removal in 7 to 10 days.  Although your dog shots are all up-to-date observe the dog.  If he does become ill make sure he is evaluated by event.  It is not impossible for dogs to contract rabies even though they have received the rabies vaccine.  Take the antibiotic Augmentin as directed for 7 days.  Wounds normally start to hurt less over time.  If you start to have increasing pain that suggestive of finger infection which will need to be evaluated right away

## 2020-07-02 NOTE — ED Provider Notes (Addendum)
[facility] EMERGENCY DEPARTMENT Provider Note   CSN: [id number] Arrival date & time: 07/02/20  1308     History Chief Complaint  Patient presents with  . Animal Bite    [name] is a 65 y.o. male.  Patient sustained dog bite to his left hand middle finger.  He was trying to remove a rabbit from his dog's mouth when the dog clamped down.  No other injuries.  Dog's shots are all up-to-date.  Patient's tetanus is up-to-date.  Patient is not diabetic.  Patient is right-hand dominant.        Past Medical History:  Diagnosis Date  . 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 Cholelithiasis   . Low testosterone     Patient Active Problem List   Diagnosis Date Noted  . Encounter for screening colonoscopy 12/04/2019    Past Surgical History:  Procedure Laterality Date  . COLONOSCOPY  2003   Dr. Jena Gauss: unable to find procedure notes as was in 2003. ?diverticula  . COLONOSCOPY N/A 04/21/2020   Procedure: COLONOSCOPY;  Surgeon: Corbin Ade, MD;  Location: [facility]O [facility];  Service: Endoscopy;  Laterality: N/A;  9:00  . LIPOMA EXCISION    . POLYPECTOMY  04/21/2020   Procedure: POLYPECTOMY INTESTINAL;  Surgeon: Corbin Ade, MD;  Location: [facility] ENDO SUITE;  Service: Endoscopy;;       Family History  Problem Relation Age of Onset  . Colon cancer Neg Hx   . Colon polyps Neg Hx     Social History   Tobacco Use  . Smoking status: Never Smoker  . Smokeless tobacco: Never Used  Vaping Use  . Vaping Use: Never used  Substance Use Topics  . Alcohol use: No  . Drug use: No    Home Medications Prior to Admission medications   Medication Sig Start Date End Date Taking? Authorizing Provider  acetaminophen (TYLENOL) 500 MG tablet Take 500-1,000 mg by mouth every 6 (six) hours as needed (pain/headaches.).   Yes [provider]  metaxalone (SKELAXIN) 800 MG tablet Take 800 mg by mouth 3 (three) times daily as needed (back tension/spasm.). 12/05/19  Yes [provider]  EPINEPHrine 0.3 mg/0.3 mL IJ SOAJ injection Inject 0.3 mg into the muscle as needed for anaphylaxis.    [provider]    Allergies    Alpha-gal, Beef-derived products, Pork-derived products, and Amoxicillin  Review of Systems   Review of Systems  Constitutional: Negative for chills and fever.  HENT: Negative for congestion, rhinorrhea and sore throat.   Eyes: Negative for visual disturbance.  Respiratory: Negative for cough and shortness of breath.   Cardiovascular: Negative for chest pain and leg swelling.  Gastrointestinal: Negative for abdominal pain, diarrhea, nausea and vomiting.  Genitourinary: Negative for dysuria.  Musculoskeletal: Negative for back pain and neck pain.  Skin: Positive for wound. Negative for rash.  Neurological: Negative for dizziness, light-headedness and headaches.  Hematological: Does not bruise/bleed easily.  Psychiatric/Behavioral: Negative for confusion.    Physical Exam Updated Vital Signs BP (!) 150/87   Pulse 62   Temp 98.8 F (37.1 C) (Oral)   Resp 17   Ht 1.829 m (6')   Wt 113.4 kg   SpO2 98%   BMI 33.91 kg/m   Physical Exam Vitals and nursing note reviewed.  Constitutional:      General: He is not in acute distress.    Appearance: Normal appearance. He is well-developed.  HENT:  Head: Normocephalic and atraumatic.  Eyes:     Extraocular Movements: Extraocular movements intact.     Conjunctiva/sclera: Conjunctivae normal.  Cardiovascular:     Rate and Rhythm: Normal rate and regular rhythm.     Heart sounds: No murmur heard.   Pulmonary:     Effort: Pulmonary effort is normal. No respiratory distress.     Breath sounds: Normal breath sounds.  Abdominal:     Palpations: Abdomen is soft.     Tenderness: There is no abdominal tenderness.  Musculoskeletal:        General: Tenderness and signs of injury present.     Cervical back: Neck supple.     Comments: Left middle finger distally.  There is  kind of a skin tear along the nail bed and on the palmar side there is a 1.5 to 2 cm laceration that is transverse.  No active bleeding.  Good cap refill.  Sensation intact.  Good flexion and extension at the DIP as well as the PIP.  Skin:    General: Skin is warm and dry.     Capillary Refill: Capillary refill takes less than 2 seconds.  Neurological:     General: No focal deficit present.     Mental Status: He is alert and oriented to person, place, and time.     Cranial Nerves: No cranial nerve deficit.     Sensory: No sensory deficit.     Motor: No weakness.     ED Results / Procedures / Treatments   Labs (all labs ordered are listed, but only abnormal results are displayed) Labs Reviewed - No data to display  EKG None  Radiology DG Finger Middle Left  Result Date: 07/02/2020 CLINICAL DATA:  Dog bite. EXAM: LEFT MIDDLE FINGER 2+V COMPARISON:  None. FINDINGS: There is no evidence of fracture or dislocation. There is no evidence of arthropathy or other focal bone abnormality. Linear metallic foreign body is seen adjacent to the distal portion of the third metacarpal. IMPRESSION: No fracture or dislocation is noted. Linear metallic foreign body is seen adjacent to the distal portion of the third metacarpal. Electronically Signed   By: Lupita Raider M.D.   On: 07/02/2020 08:28    Procedures Procedures   Medications Ordered in ED Medications  lidocaine (PF) (XYLOCAINE) 1 % injection 10 mL (has no administration in time range)    ED Course  I have reviewed the triage vital signs and the nursing notes.  Pertinent labs & imaging results that were available during my care of the patient were reviewed by me and considered in my medical decision making (see chart for details).    MDM Rules/Calculators/A&P                          X-ray shows no bony abnormality.  There is evidence of an old foreign body does not in the area of today's injury.  Physician assistant will go  ahead and loosely closed to the laceration to the index finger on the palmar side.  Patient has allergy listed to amoxicillin.  But supposedly just list nausea and vomiting.  Does not sound to be a true allergy.  So patient will be treated with Augmentin for the dog bite.  Patient will be given precautions just to observe his dog if anything unusual or he becomes sick although he has had his shots is not impossible for him to get rabies.  Patient will return for  any signs of infection to the finger.  Precautions given.  I provided a substantive portion of the care of this patient.  I personally performed the entirety of the history, exam and medical decision making for this encounter.  Patient would prefer not to take Augmentin.  He gets significant nausea and vomiting from amoxicillin and Augmentin.  So we will go ahead and treat him with doxycycline and clindamycin.   Final Clinical Impression(s) / ED Diagnoses Final diagnoses:  Dog bite, initial encounter  Laceration of left middle finger without foreign body with damage to nail, initial encounter    Rx / DC Orders ED Discharge Orders    None       Vanetta Mulders, MD 07/02/20 0254    Vanetta Mulders, MD 07/02/20 [phone]

## 2021-09-23 ENCOUNTER — Other Ambulatory Visit: Payer: Self-pay | Admitting: Otolaryngology

## 2021-09-23 DIAGNOSIS — H903 Sensorineural hearing loss, bilateral: Secondary | ICD-10-CM

## 2021-10-17 ENCOUNTER — Ambulatory Visit
Admission: RE | Admit: 2021-10-17 | Discharge: 2021-10-17 | Disposition: A | Discharge status: Home / Self Care | Payer: 59 | Source: Ambulatory Visit | Attending: Otolaryngology | Admitting: Otolaryngology

## 2021-10-17 DIAGNOSIS — H903 Sensorineural hearing loss, bilateral: Secondary | ICD-10-CM

## 2021-10-17 MED ORDER — GADOBENATE DIMEGLUMINE 529 MG/ML IV SOLN
20.0000 mL | Freq: Once | INTRAVENOUS | Status: AC | PRN
  Administered 2021-10-17: 20 mL via INTRAVENOUS

## 2021-12-31 IMAGING — DX DG FINGER MIDDLE 2+V*L*
3 series · 3 of 3 positions shown · non-contrast
Comparison: None.

CLINICAL DATA: Dog bite.

EXAM:
LEFT MIDDLE FINGER 2+V

[finger ap]
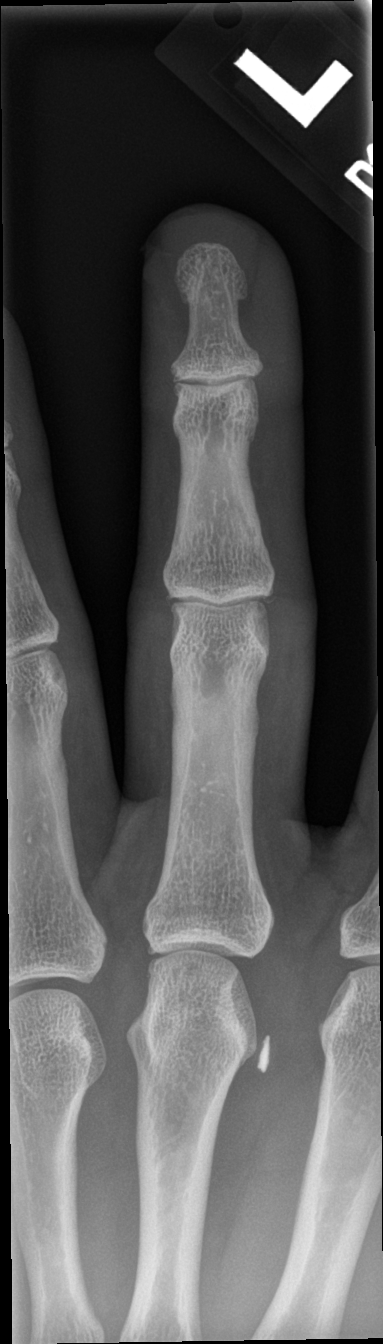

[finger obl]
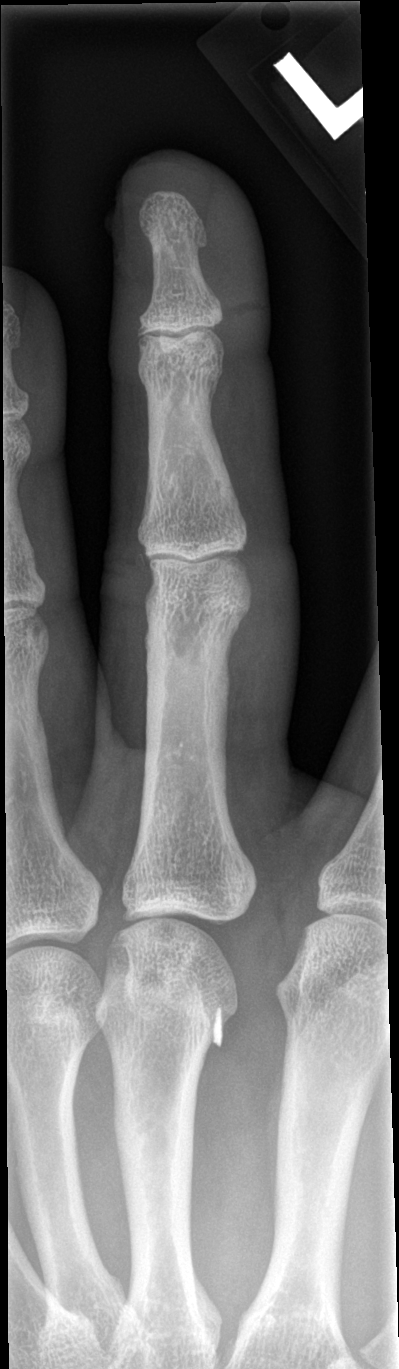

[finger lat]
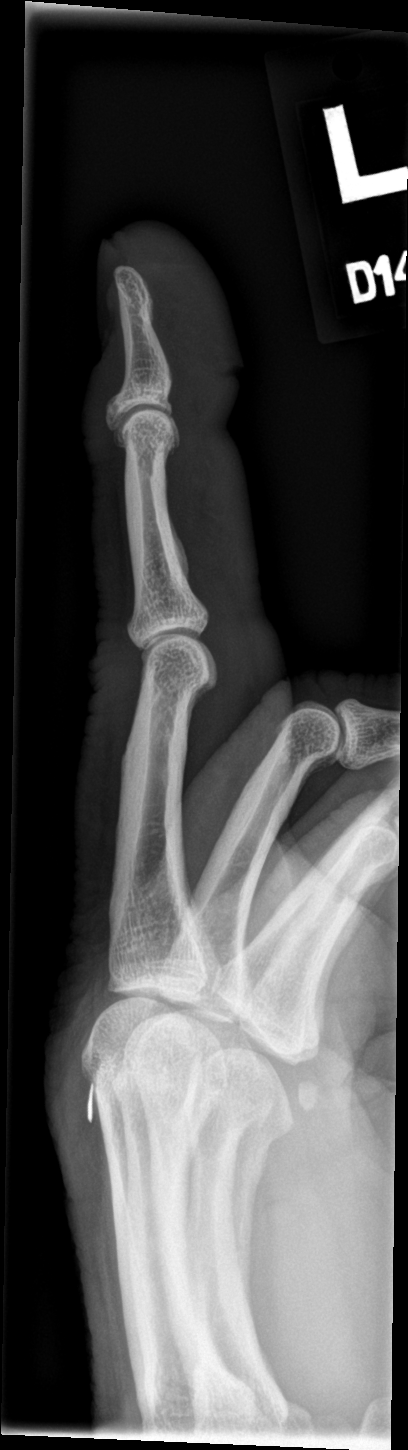

[3 of 3 positions shown; findings below may reference images not displayed]

FINDINGS: There is no evidence of fracture or dislocation. There is no
evidence of arthropathy or other focal bone abnormality. Linear
metallic foreign body is seen adjacent to the distal portion of the
third metacarpal.
IMPRESSION: No fracture or dislocation is noted. Linear metallic foreign body is
seen adjacent to the distal portion of the third metacarpal.

## 2022-02-03 NOTE — Progress Notes (Signed)
CARDIOLOGY CONSULT NOTE       Patient ID: [name] MRN: [id number] DOB/AGE: 1956/02/10 66 y.o.  Admit date: (Not on file) Referring Physician: Willey Blade Primary Physician: Asencion Noble, MD Primary Cardiologist: New Reason for Consultation: Afib    HPI:  66 y.o. referred by Dr Willey Blade for afib.  History of BPH ( PSA 2.3 ) and Diverticulosis Complained on physical of intermittent palpitations and rapid HR at night He thinks his Fitbit shows afib. His ECG did not showing only NSR rate 82 bpm He contracted Alpha Gal and is watching his diet  ECG in our office today shows NSR rate 73 and is normal   He stopped using his fitbit and has a simple single lead Kardia Device I reviewed some tracings from that over last week and looks like sinus with rare PAC  Non smoker no drinker. 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   Cholelithiasis    Low testosterone     Family History  Problem Relation Age of Onset   Cancer Mother    Colon cancer Neg Hx    Colon polyps Neg Hx     Social History   Socioeconomic History   Marital status: Married    Spouse name: Not on file   Number of children: Not on file   Years of education: Not on file   Highest education level: Not on file  Occupational History   Not on file  Tobacco Use   Smoking status: Never   Smokeless tobacco: Never  Vaping Use   Vaping Use: Never used  Substance and Sexual Activity   Alcohol use: No   Drug use: No   Sexual activity: Not on file  Other Topics Concern   Not on file  Social History Narrative   Not on file   Social Determinants of Health   Financial Resource Strain: Not on file  Food Insecurity:  Not on file  Transportation Needs: Not on file  Physical Activity: Not on file  Stress: Not on file  Social Connections: Not on file  Intimate Partner Violence: Not on file    Past Surgical History:  Procedure Laterality Date   COLONOSCOPY  2003   Dr. Gala Romney: unable to find procedure notes as was in 2003. ?diverticula   COLONOSCOPY N/A 04/21/2020   Procedure: COLONOSCOPY;  Surgeon: Daneil Dolin, MD;  [facility];  Service: Endoscopy;  Laterality: N/A;  9:00   LIPOMA EXCISION     POLYPECTOMY  04/21/2020   Procedure: POLYPECTOMY INTESTINAL;  Surgeon: Daneil Dolin, MD;  [facility];  Service: Endoscopy;;      Current Outpatient Medications:    acetaminophen (TYLENOL) 500 MG tablet, Take 500-1,000 mg by mouth every 6 (six) hours as needed (pain/headaches.)., Disp: , Rfl:    EPINEPHrine 0.3 mg/0.3 mL IJ SOAJ injection, Inject 0.3 mg into the muscle as needed for anaphylaxis., Disp: , Rfl:    metaxalone (SKELAXIN) 800 MG tablet, Take 800 mg by mouth 3 (three) times daily as needed (back tension/spasm.)., Disp: , Rfl:     Physical Exam: Blood pressure 130/80, pulse  73, height 6' (1.829 m), weight 252 lb 12.8 oz (114.7 kg), SpO2 98 %.    Affect appropriate Healthy:  appears stated age HEENT: normal Neck supple with no adenopathy JVP normal no bruits no thyromegaly Lungs clear with no wheezing and good diaphragmatic motion Heart:  S1/S2 no murmur, no rub, gallop or click PMI normal Abdomen: benighn, BS positve, no tenderness, no AAA no bruit.  No HSM or HJR Distal pulses intact with no bruits No edema Neuro non-focal Skin warm and dry No muscular weakness   Labs:   Lab Results  Component Value Date   WBC 7.1 03/17/2012   HGB 16.2 03/17/2012   HCT 45.5 03/17/2012   MCV 90.5 03/17/2012   PLT 208 03/17/2012   No results for input(s): "NA", "K", "CL", "CO2", "BUN", "CREATININE", "CALCIUM", "PROT", "BILITOT", "ALKPHOS", "ALT", "AST", "GLUCOSE" in  the last 168 hours.  Invalid input(s): "LABALBU" Lab Results  Component Value Date   TROPONINI <0.30 03/17/2012   No results found for: "CHOL" No results found for: "HDL" No results found for: "LDLCALC" No results found for: "TRIG" No results found for: "CHOLHDL" No results found for: "LDLDIRECT"    Radiology: No results found.  EKG: See HPI NSR normal    ASSESSMENT AND PLAN:   Palpitations:  ECG in primary office normal Not clear what fit bit is picking up Differential includes PAF/Atrial tachycardia or SVT TTE to r/o structural heart issues. Continue to use Kardia device will call for monitor if palpitations more frequent  BPH:  f/u urology follow PSA  Alpha Gal:  dietary changes made  CAD:  risk assessment Calcium score  TTE Calcium Score   F/U 4-6 weeks   Signed: Charlton Haws 02/13/2022, 3:44 PM

## 2022-02-13 ENCOUNTER — Encounter: Payer: Self-pay | Admitting: Cardiovascular Disease

## 2022-02-13 ENCOUNTER — Ambulatory Visit: Payer: 59 | Attending: Cardiovascular Disease | Admitting: Cardiovascular Disease

## 2022-02-13 VITALS — BP 130/80 | HR 73 | Ht 72.0 in | Wt 252.8 lb

## 2022-02-13 DIAGNOSIS — I480 Paroxysmal atrial fibrillation: Secondary | ICD-10-CM | POA: Diagnosis not present

## 2022-02-13 DIAGNOSIS — I4891 Unspecified atrial fibrillation: Secondary | ICD-10-CM | POA: Diagnosis not present

## 2022-02-13 DIAGNOSIS — R002 Palpitations: Secondary | ICD-10-CM | POA: Diagnosis not present

## 2022-02-13 NOTE — Patient Instructions (Addendum)
Medication Instructions:  Your physician recommends that you continue on your current medications as directed. Please refer to the Current Medication list given to you today.   Labwork: None today  Testing/Procedures: Your physician has requested that you have an echocardiogram. Echocardiography is a painless test that uses sound waves to create images of your heart. It provides your doctor with information about the size and shape of your heart and how well your heart's chambers and valves are working. This procedure takes approximately one hour. There are no restrictions for this procedure. Please do NOT wear cologne, perfume, aftershave, or lotions (deodorant is allowed). Please arrive 15 minutes prior to your appointment time.    Schedule Cardiac Calcium score CT  Follow-Up: 4-6 weeks, Dr.Nishan at the [facility]  Any Other Special Instructions Will Be Listed Below (If Applicable).  If you need a refill on your cardiac medications before your next appointment, please call your pharmacy.

## 2022-02-14 ENCOUNTER — Encounter: Payer: Self-pay | Admitting: Internal Medicine

## 2022-02-24 ENCOUNTER — Ambulatory Visit (HOSPITAL_COMMUNITY)
Admission: RE | Admit: 2022-02-24 | Discharge: 2022-02-24 | Disposition: A | Discharge status: Home / Self Care | Payer: 59 | Source: Ambulatory Visit | Attending: Cardiovascular Disease | Admitting: Cardiovascular Disease

## 2022-02-24 DIAGNOSIS — I480 Paroxysmal atrial fibrillation: Secondary | ICD-10-CM | POA: Diagnosis present

## 2022-02-24 LAB — ECHOCARDIOGRAM COMPLETE
AR max vel: 3.42 cm2
AV Area VTI: 3.2 cm2
AV Area mean vel: 3.32 cm2
AV Mean grad: 5.1 mmHg
AV Peak grad: 10.1 mmHg
Ao pk vel: 1.59 m/s
Area-P 1/2: 3.85 cm2
S' Lateral: 2.4 cm

## 2022-02-24 NOTE — Progress Notes (Signed)
*  PRELIMINARY RESULTS* Echocardiogram 2D Echocardiogram has been performed.  Stacey Drain 02/24/2022, 9:02 AM

## 2022-02-27 ENCOUNTER — Ambulatory Visit (HOSPITAL_COMMUNITY)
Admission: RE | Admit: 2022-02-27 | Discharge: 2022-02-27 | Disposition: A | Discharge status: Home / Self Care | Payer: 59 | Source: Ambulatory Visit | Attending: Cardiovascular Disease | Admitting: Cardiovascular Disease

## 2022-02-27 ENCOUNTER — Telehealth: Payer: Self-pay

## 2022-02-27 ENCOUNTER — Encounter: Payer: Self-pay | Admitting: *Deleted

## 2022-02-27 DIAGNOSIS — I4891 Unspecified atrial fibrillation: Secondary | ICD-10-CM | POA: Insufficient documentation

## 2022-02-27 DIAGNOSIS — I480 Paroxysmal atrial fibrillation: Secondary | ICD-10-CM

## 2022-02-27 DIAGNOSIS — R002 Palpitations: Secondary | ICD-10-CM

## 2022-02-27 NOTE — Progress Notes (Signed)
Patient enrolled for [employer] to ship a 30 day cardiac event monitor to [address] [place], [place]  [address].

## 2022-02-27 NOTE — Telephone Encounter (Signed)
The patient has been notified of the result and verbalized understanding.  All questions (if any) were answered. Ethelda Chick, RN 02/27/2022 1:51 PM   Having episodes of A. Fib every time he does strenuous work. Discussed with Dr. Tillie Fantasia. He recommends event monitor. Will place order for patient to get monitor mailed to him.

## 2022-02-27 NOTE — Telephone Encounter (Signed)
-----   Message from Wendall Stade, MD sent at 02/25/2022  2:10 PM EST ----- Normal echo with good EF and no significant valvular heart disease

## 2022-02-28 ENCOUNTER — Encounter: Payer: Self-pay | Admitting: *Deleted

## 2022-03-06 ENCOUNTER — Encounter: Payer: Self-pay | Admitting: Internal Medicine

## 2022-03-06 ENCOUNTER — Ambulatory Visit: Payer: 59 | Attending: Cardiovascular Disease

## 2022-03-06 DIAGNOSIS — R002 Palpitations: Secondary | ICD-10-CM | POA: Diagnosis not present

## 2022-03-06 DIAGNOSIS — I480 Paroxysmal atrial fibrillation: Secondary | ICD-10-CM | POA: Diagnosis not present

## 2022-03-07 ENCOUNTER — Telehealth: Payer: Self-pay

## 2022-03-07 MED ORDER — METOPROLOL SUCCINATE ER 25 MG PO TB24: 25.0000 mg | ORAL_TABLET | Freq: Every day | ORAL | 3 refills | Status: DC

## 2022-03-07 NOTE — Telephone Encounter (Signed)
   Cardiac Monitor Alert  Date of alert:  03/07/2022   Patient Name: [name]  DOB: 04/28/1955  MRN: [id number]   [facility] Cardiologist: Charlton Haws, MD  [facility] EP:  None    Monitor Information: Cardiac Event Monitor [[employer]]  Reason:  palpitation Ordering provider:  Dr. Eden Emms   Alert Atrial Fibrillation/Flutter This is the 1st alert for this rhythm.  The patient has no hx of Atrial Fibrillation/Flutter.  The patient is not currently on anticoagulation.  Next Cardiology Appointment   Date: not made yet Provider:  Dr. Eden Emms  The patient was contacted today.  He is asymptomatic. Patient stated he felt that he was in A. FIB, but he was just resting at the time. Arrhythmia, symptoms and history reviewed with Dr. Katrinka Blazing DOD.  Plan:  Continue to monitor. Start patient on metoprolol succinate 25 mg daily. Will see burden of A. FIB on monitor before starting on any blood thinners.  Patient would rather not be on blood thinners if he can help it. Will send message to Dr. Eden Emms so he will aware.   Ethelda Chick, RN  03/07/2022 9:03 AM

## 2022-03-07 NOTE — Progress Notes (Signed)
See phone note on 03/07/22.

## 2022-03-13 ENCOUNTER — Telehealth: Payer: Self-pay | Admitting: Cardiovascular Disease

## 2022-03-13 MED ORDER — DILTIAZEM HCL ER 120 MG PO TB24: 120.0000 mg | ORAL_TABLET | Freq: Every day | ORAL | 11 refills | Status: DC

## 2022-03-13 NOTE — Telephone Encounter (Signed)
Brian Stade, MD  You11 minutes ago (10:01 AM)   Can stop Toprol and start cardizem 120 mg LA in a week after stopping Toprol  Left message for patient to call back.

## 2022-03-13 NOTE — Telephone Encounter (Signed)
Called patient back to let him know Dr. Fabio Bering advisement. Patient will stop metoprolol and start cardizem 120 mg next Monday.

## 2022-03-13 NOTE — Telephone Encounter (Signed)
Pt c/o medication issue:  1. Name of Medication: metoprolol succinate (TOPROL XL) 25 MG 24 hr tablet   2. How are you currently taking this medication (dosage and times per day)? Take 1 tablet (25 mg total) by mouth daily   3. Are you having a reaction (difficulty breathing--STAT)? no  4. What is your medication issue? Patient feels then dosage of medication is too strong. He states it has him feeling terrible. Please advise

## 2022-03-13 NOTE — Telephone Encounter (Signed)
Called patient back about his message. Patient stated he has felt terrible since he started Toprol XL 25 mg. Patient started taking it a week ago and stated he gradually felt worse through out the week, and by Saturday he had zero energy. Patient stated he felt like his head was in the clouds. Patient stated one day his BP was 99/46 and his HR have been in the 50's. Patient decided two days ago to cut his dose in half 12.5 mg and stated he feels a little better, but not back to his old self. Patient stated he has not had any A. FIB episode since being on the medication. Will forward to Dr. Eden Emms for advisement.

## 2022-03-13 NOTE — Telephone Encounter (Signed)
Pt is returning call.  

## 2022-04-18 ENCOUNTER — Telehealth: Payer: Self-pay

## 2022-04-18 DIAGNOSIS — I480 Paroxysmal atrial fibrillation: Secondary | ICD-10-CM

## 2022-04-18 MED ORDER — APIXABAN 5 MG PO TABS: 5.0000 mg | ORAL_TABLET | Freq: Two times a day (BID) | ORAL | 11 refills | Status: DC

## 2022-04-18 NOTE — Telephone Encounter (Signed)
Called patient about results. Patient will get lab work at [facility] this Friday. Put in referral for EP to discuss AAT/ablation, patient will need to see Dr. Myles Gip in [place] (closest office with EP doctor who does A. FIB Ablations). Will send message to [facility] to see if they can give patient samples and coupon cards.

## 2022-04-18 NOTE — Telephone Encounter (Signed)
Patient notified Eliquis 5mg  tablets and co-pay card available for pick up in the [place] office. Pt stated he would come by the office on Friday.   Lot #: [id number] Exp: 09/2023  Pt has commercial insurance through [employer]- will provide $10 co-pay card for use.

## 2022-04-18 NOTE — Telephone Encounter (Signed)
-----   Message from Josue Hector, MD sent at 04/17/2022  7:14 AM EST ----- Frequent episodes of PAF start eliquis 5 bid check BMET/CBC Continue cardizem F/U with EP to discuss AAT/ ablation

## 2022-04-22 LAB — CBC WITH DIFFERENTIAL/PLATELET
Basophils Absolute: 0 10*3/uL (ref 0.0–0.2)
Basos: 1 %
EOS (ABSOLUTE): 0.1 10*3/uL (ref 0.0–0.4)
Eos: 2 %
Hematocrit: 43 % (ref 37.5–51.0)
Hemoglobin: 14.8 g/dL (ref 13.0–17.7)
Immature Grans (Abs): 0 10*3/uL (ref 0.0–0.1)
Immature Granulocytes: 0 %
Lymphocytes Absolute: 1.1 10*3/uL (ref 0.7–3.1)
Lymphs: 21 %
MCH: 32 pg (ref 26.6–33.0)
MCHC: 34.4 g/dL (ref 31.5–35.7)
MCV: 93 fL (ref 79–97)
Monocytes Absolute: 0.4 10*3/uL (ref 0.1–0.9)
Monocytes: 8 %
Neutrophils Absolute: 3.6 10*3/uL (ref 1.4–7.0)
Neutrophils: 68 %
Platelets: 240 10*3/uL (ref 150–450)
RBC: 4.63 x10E6/uL (ref 4.14–5.80)
RDW: 13.1 % (ref 11.6–15.4)
WBC: 5.3 10*3/uL (ref 3.4–10.8)

## 2022-04-22 LAB — BASIC METABOLIC PANEL
BUN/Creatinine Ratio: 17 (ref 10–24)
BUN: 15 mg/dL (ref 8–27)
CO2: 22 mmol/L (ref 20–29)
Calcium: 9.4 mg/dL (ref 8.6–10.2)
Chloride: 103 mmol/L (ref 96–106)
Creatinine, Ser: 0.89 mg/dL (ref 0.76–1.27)
Glucose: 120 mg/dL — ABNORMAL HIGH (ref 70–99)
Potassium: 4.5 mmol/L (ref 3.5–5.2)
Sodium: 140 mmol/L (ref 134–144)
eGFR: 95 mL/min/{1.73_m2} (ref >59)

## 2022-06-08 ENCOUNTER — Encounter: Payer: Self-pay | Admitting: Cardiovascular Disease

## 2022-06-08 ENCOUNTER — Telehealth: Payer: Self-pay | Admitting: Cardiovascular Disease

## 2022-06-08 ENCOUNTER — Ambulatory Visit: Payer: 59 | Attending: Cardiovascular Disease | Admitting: Cardiovascular Disease

## 2022-06-08 VITALS — BP 120/72 | HR 90 | Ht 72.0 in | Wt 245.8 lb

## 2022-06-08 DIAGNOSIS — I480 Paroxysmal atrial fibrillation: Secondary | ICD-10-CM | POA: Diagnosis not present

## 2022-06-08 NOTE — Telephone Encounter (Signed)
Calling back to give more dates for availability to have the ablation done from May 6 till June 4th. Please advise

## 2022-06-08 NOTE — Progress Notes (Signed)
Electrophysiology Office Note:    Date:  06/08/2022   ID:  [name], DOB May 30, 1955, MRN [id number]  PCP:  Asencion Noble, [facility] Cardiologist:  Jenkins Rouge, MD     Referring MD: Josue Hector, MD   History of Present Illness:    [name] is a 67 y.o. male with a hx listed below, significant for alpha-gal, referred for AF management.  He first noticed heart rhythm irregularities at the end of the [place] pandemic.  These occurred very intermittently.  Symptoms included fatigue and palpitations.  He got a new Fitbit watch and July of this past year and it alerted him multiple times to episodes of atrial fibrillation, particular at nighttime.  A [employer] monitor was placed that confirmed the diagnosis of atrial fibrillation.  In our office today, he reports that he felt that he converted to atrial fibrillation in the lobby prior to our visit.  EKG shows atrial fibrillation.  Has a history of alpha gal and sometimes tolerates small amounts of pork.  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 Gala Romney: unable to find procedure notes as was in 2003. ?diverticula   COLONOSCOPY N/A 04/21/2020   Procedure: COLONOSCOPY;  Surgeon: Daneil Dolin, MD;  [facility];  Service: Endoscopy;  Laterality: N/A;  9:00   LIPOMA EXCISION     POLYPECTOMY  04/21/2020   Procedure: POLYPECTOMY INTESTINAL;  Surgeon: Daneil Dolin, MD;  [facility];  Service: Endoscopy;;    Current Medications: Current Meds  Medication Sig   acetaminophen (TYLENOL) 500 MG tablet Take 500-1,000 mg by mouth every 6 (six) hours as needed (pain/headaches.).   apixaban (ELIQUIS) 5 MG TABS tablet Take 1 tablet (5 mg total) by mouth 2 (two) times daily.   diltiazem  (CARDIZEM CD) 120 MG 24 hr capsule Take 120 mg by mouth daily.   EPINEPHrine 0.3 mg/0.3 mL IJ SOAJ injection Inject 0.3 mg into the muscle as needed for anaphylaxis.   metaxalone (SKELAXIN) 800 MG tablet Take 800 mg by mouth 3 (three) times daily as needed (back tension/spasm.).   [DISCONTINUED] diltiazem (CARDIZEM LA) 120 MG 24 hr tablet Take 1 tablet (120 mg total) by mouth daily.     Allergies:   Alpha-gal, Beef-derived products, Pork-derived products, and Amoxicillin   Social History   Socioeconomic History   Marital status: Married    Spouse name: Not on file   Number of children: Not on file   Years of education: Not on file   Highest education level: Not on file  Occupational History   Not on file  Tobacco Use   Smoking status: Never   Smokeless tobacco: Never  Vaping Use   Vaping Use: Never used  Substance and Sexual Activity   Alcohol use: No   Drug use: No   Sexual activity: Not on file  Other Topics Concern   Not on file  Social History Narrative   Not on file   Social Determinants of Health   Financial Resource Strain: Not on file  Food Insecurity: Not on file  Transportation Needs: Not on file  Physical Activity: Not on file  Stress: Not on file  Social Connections: Not  on file     Family History: The patient's family history includes Cancer in his mother. There is no history of Colon cancer or Colon polyps.  ROS:   Please see the history of present illness.    All other systems reviewed and are negative.  EKGs/Labs/Other Studies Reviewed Today:     Monitor 03/06/2022 AF detected TTE: 02/2022 EF 65-70%. Normal LA size.   EKG:  Last EKG results: today - AF   Recent Labs: 04/21/2022: BUN 15; Creatinine, Ser 0.89; Hemoglobin 14.8; Platelets 240; Potassium 4.5; Sodium 140     Physical Exam:    VS:  BP 120/72   Pulse 90   Ht 6' (1.829 m)   Wt 245 lb 12.8 oz (111.5 kg)   SpO2 98%   BMI 33.34 kg/m     Wt Readings from Last 3 Encounters:   06/08/22 245 lb 12.8 oz (111.5 kg)  02/13/22 252 lb 12.8 oz (114.7 kg)  07/02/20 250 lb (113.4 kg)     GEN: Well nourished, well developed in no acute distress CARDIAC: RRR, no murmurs, rubs, gallops RESPIRATORY:  Normal work of breathing MUSCULOSKELETAL: no edema    ASSESSMENT & PLAN:    Paroxysmal atrial fibrillation: symptomatic. We discussed management options including ablation and antiarrhythmic medications. We discussed the indication, rationale, logistics, anticipated benefits, and potential risks of the ablation procedure including but not limited to -- bleed at the groin access site, chest pain, damage to nearby organs such as the diaphragm, lungs, or esophagus, need for a drainage tube, or prolonged hospitalization. I explained that the risk for stroke, heart attack, need for open chest surgery, or even death is very low but not zero. he  expressed understanding and wishes to proceed.  Alpha Gal allergy: will need to use an alternative anticoagulant during the procedure        Medication Adjustments/Labs and Tests Ordered: Current medicines are reviewed at length with the patient today.  Concerns regarding medicines are outlined above.  Orders Placed This Encounter  Procedures   EKG 12-Lead   No orders of the defined types were placed in this encounter.    Signed, Melida Quitter, MD  06/08/2022 2:29 PM    [place]

## 2022-06-08 NOTE — Patient Instructions (Signed)
Medication Instructions:  Your physician recommends that you continue on your current medications as directed. Please refer to the Current Medication list given to you today.  *If you need a refill on your cardiac medications before your next appointment, please call your pharmacy*   Lab Work: Pre procedure labs -- see procedure instruction letter:  BMP & CBC  If you have labs (blood work) drawn today and your tests are completely normal, you will receive your results only by: [place] (if you have MyChart) OR A paper copy in the mail If you have any lab test that is abnormal or we need to change your treatment, we will call you to review the results.   Testing/Procedures: Your physician has requested that you have cardiac CT within 7 days PRIOR to your ablation. Cardiac computed tomography (CT) is a painless test that uses an x-ray machine to take clear, detailed pictures of your heart.  Please follow instruction below located under "other instructions". You will get a call from our office to schedule the date for this test.  Your physician has recommended that you have an ablation. Catheter ablation is a medical procedure used to treat some cardiac arrhythmias (irregular heartbeats). During catheter ablation, a long, thin, flexible tube is put into a blood vessel in your groin (upper thigh), or neck. This tube is called an ablation catheter. It is then guided to your heart through the blood vessel. Radio frequency waves destroy small areas of heart tissue where abnormal heartbeats may cause an arrhythmia to start.   EP scheduler will call you to arrange this procedure   Follow-Up: At [facility], you and your health needs are our priority.  As part of our continuing mission to provide you with exceptional heart care, we have created designated Provider Care Teams.  These Care Teams include your primary Cardiologist (physician) and Advanced Practice Providers (APPs -  Physician  Assistants and Nurse Practitioners) who all work together to provide you with the care you need, when you need it.  We recommend signing up for the patient portal called "MyChart".  Sign up information is provided on this After Visit Summary.  MyChart is used to connect with patients for Virtual Visits (Telemedicine).  Patients are able to view lab/test results, encounter notes, upcoming appointments, etc.  Non-urgent messages can be sent to your provider as well.   To learn more about what you can do with MyChart, go to [email or web address].    Your next appointment:   1 month(s) after your ablation  The format for your next appointment:   In Person  Provider:   AFib clinic   Thank you for choosing [facility]!!   [phone]    Other Instructions   Cardiac Ablation Cardiac ablation is a procedure to destroy (ablate) some heart tissue that is sending bad signals. These bad signals cause problems in heart rhythm. The heart has many areas that make these signals. If there are problems in these areas, they can make the heart beat in a way that is not normal. Destroying some tissues can help make the heart rhythm normal. Tell your doctor about: Any allergies you have. All medicines you are taking. These include vitamins, herbs, eye drops, creams, and over-the-counter medicines. Any problems you or family members have had with medicines that make you fall asleep (anesthetics). Any blood disorders you have. Any surgeries you have had. Any medical conditions you have, such as kidney failure. Whether you are pregnant or may be  pregnant. What are the risks? This is a safe procedure. But problems may occur, including: Infection. Bruising and bleeding. Bleeding into the chest. Stroke or blood clots. Damage to nearby areas of your body. Allergies to medicines or dyes. The need for a pacemaker if the normal system is damaged. Failure of the procedure to treat the  problem. What happens before the procedure? Medicines Ask your doctor about: Changing or stopping your normal medicines. This is important. Taking aspirin and ibuprofen. Do not take these medicines unless your doctor tells you to take them. Taking other medicines, vitamins, herbs, and supplements. General instructions Follow instructions from your doctor about what you cannot eat or drink. Plan to have someone take you home from the hospital or clinic. If you will be going home right after the procedure, plan to have someone with you for 24 hours. Ask your doctor what steps will be taken to prevent infection. What happens during the procedure?  An IV tube will be put into one of your veins. You will be given a medicine to help you relax. The skin on your neck or groin will be numbed. A cut (incision) will be made in your neck or groin. A needle will be put through your cut and into a large vein. A tube (catheter) will be put into the needle. The tube will be moved to your heart. Dye may be put through the tube. This helps your doctor see your heart. Small devices (electrodes) on the tube will send out signals. A type of energy will be used to destroy some heart tissue. The tube will be taken out. Pressure will be held on your cut. This helps stop bleeding. A bandage will be put over your cut. The exact procedure may vary among doctors and hospitals. What happens after the procedure? You will be watched until you leave the hospital or clinic. This includes checking your heart rate, breathing rate, oxygen, and blood pressure. Your cut will be watched for bleeding. You will need to lie still for a few hours. Do not drive for 24 hours or as long as your doctor tells you. Summary Cardiac ablation is a procedure to destroy some heart tissue. This is done to treat heart rhythm problems. Tell your doctor about any medical conditions you may have. Tell him or her about all medicines you are  taking to treat them. This is a safe procedure. But problems may occur. These include infection, bruising, bleeding, and damage to nearby areas of your body. Follow what your doctor tells you about food and drink. You may also be told to change or stop some of your medicines. After the procedure, do not drive for 24 hours or as long as your doctor tells you. This information is not intended to replace advice given to you by your health care provider. Make sure you discuss any questions you have with your health care provider. Document Revised: 06/17/2021 Document Reviewed: 02/27/2019 Elsevier Patient Education  [employer].

## 2022-06-13 ENCOUNTER — Other Ambulatory Visit: Payer: Self-pay

## 2022-06-13 DIAGNOSIS — I480 Paroxysmal atrial fibrillation: Secondary | ICD-10-CM

## 2022-06-13 NOTE — Telephone Encounter (Signed)
Work up is complete for Ablation...  CT and Cath Lab are aware of "Alpha-gal"  Pt will have labs done at [facility] in [place]

## 2022-06-28 ENCOUNTER — Telehealth: Payer: Self-pay | Admitting: Cardiovascular Disease

## 2022-06-28 NOTE — Telephone Encounter (Signed)
Patient states he will need a note from his cardiologist for DOT/Mobile [place] license stating he is OK to work.  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

## 2022-06-30 NOTE — Telephone Encounter (Signed)
Called patient to let him know a letter has been done and we will mail it to him.

## 2022-08-21 LAB — CBC
Hematocrit: 43.7 % (ref 37.5–51.0)
Hemoglobin: 15.3 g/dL (ref 13.0–17.7)
MCH: 32.6 pg (ref 26.6–33.0)
MCHC: 35 g/dL (ref 31.5–35.7)
MCV: 93 fL (ref 79–97)
Platelets: 252 10*3/uL (ref 150–450)
RBC: 4.7 x10E6/uL (ref 4.14–5.80)
RDW: 13.2 % (ref 11.6–15.4)
WBC: 6.2 10*3/uL (ref 3.4–10.8)

## 2022-08-22 ENCOUNTER — Telehealth (HOSPITAL_COMMUNITY): Payer: Self-pay | Admitting: *Deleted

## 2022-08-22 LAB — BASIC METABOLIC PANEL
BUN/Creatinine Ratio: 16 (ref 10–24)
BUN: 16 mg/dL (ref 8–27)
CO2: 22 mmol/L (ref 20–29)
Calcium: 9.6 mg/dL (ref 8.6–10.2)
Chloride: 101 mmol/L (ref 96–106)
Creatinine, Ser: 0.99 mg/dL (ref 0.76–1.27)
Glucose: 100 mg/dL — ABNORMAL HIGH (ref 70–99)
Potassium: 4.7 mmol/L (ref 3.5–5.2)
Sodium: 139 mmol/L (ref 134–144)
eGFR: 85 mL/min/{1.73_m2} (ref >59)

## 2022-08-22 NOTE — Telephone Encounter (Signed)
Reaching out to patient to offer assistance regarding upcoming cardiac imaging study; pt verbalizes understanding of appt date/time, parking situation and where to check in, pre-test NPO status, and verified current allergies; name and call back number provided for further questions should they arise ? ?Markel Kurtenbach RN Navigator Cardiac Imaging ?[facility] ?[phone] office ?[phone] cell ? ?Patient aware to arrive at 10:30am. ?

## 2022-08-23 ENCOUNTER — Ambulatory Visit (HOSPITAL_COMMUNITY)
Admission: RE | Admit: 2022-08-23 | Discharge: 2022-08-23 | Disposition: A | Discharge status: Home / Self Care | Payer: 59 | Source: Ambulatory Visit | Attending: Cardiovascular Disease | Admitting: Cardiovascular Disease

## 2022-08-23 DIAGNOSIS — I480 Paroxysmal atrial fibrillation: Secondary | ICD-10-CM

## 2022-08-23 MED ORDER — IOHEXOL 350 MG/ML SOLN
100.0000 mL | Freq: Once | INTRAVENOUS | Status: AC | PRN
  Administered 2022-08-23: 100 mL via INTRAVENOUS

## 2022-08-29 ENCOUNTER — Telehealth: Payer: Self-pay | Admitting: Cardiovascular Disease

## 2022-08-29 NOTE — Pre-Procedure Instructions (Signed)
Instructed patient on the following items: Arrival time 0600  Nothing to eat or drink after midnight No meds AM of procedure Responsible person to drive you home and stay with you for 24 hrs  Have you missed any doses of anti-coagulant Eliquis- take twice a day, hasn't missed any doses.  Don't take dose in the morning.

## 2022-08-29 NOTE — Telephone Encounter (Signed)
Patient is calling because he is having a procedure tomorrow. Patient would like to know how long he would need to stay in a recovery room after the procedure. Please advise.

## 2022-08-29 NOTE — Telephone Encounter (Signed)
Spoke with patient, confirmed timing for ablation tomorrow. No further questions at this time

## 2022-08-30 ENCOUNTER — Other Ambulatory Visit (HOSPITAL_COMMUNITY): Payer: Self-pay

## 2022-08-30 ENCOUNTER — Encounter (HOSPITAL_COMMUNITY): Payer: Self-pay | Admitting: Cardiovascular Disease

## 2022-08-30 ENCOUNTER — Ambulatory Visit (HOSPITAL_COMMUNITY)
Admission: RE | Admit: 2022-08-30 | Discharge: 2022-08-30 | Disposition: A | Discharge status: Home / Self Care | Payer: 59 | Source: Ambulatory Visit | Attending: Cardiovascular Disease | Admitting: Cardiovascular Disease

## 2022-08-30 ENCOUNTER — Other Ambulatory Visit: Payer: Self-pay

## 2022-08-30 ENCOUNTER — Ambulatory Visit (HOSPITAL_COMMUNITY): Payer: 59 | Admitting: Certified Registered Nurse Anesthetist

## 2022-08-30 ENCOUNTER — Ambulatory Visit (HOSPITAL_BASED_OUTPATIENT_CLINIC_OR_DEPARTMENT_OTHER): Payer: 59 | Admitting: Certified Registered Nurse Anesthetist

## 2022-08-30 ENCOUNTER — Encounter (HOSPITAL_COMMUNITY)
Admission: RE | Disposition: A | Discharge status: Home / Self Care | Payer: Self-pay | Source: Ambulatory Visit | Attending: Cardiovascular Disease

## 2022-08-30 DIAGNOSIS — I4891 Unspecified atrial fibrillation: Secondary | ICD-10-CM | POA: Diagnosis not present

## 2022-08-30 DIAGNOSIS — Z91014 Allergy to mammalian meats: Secondary | ICD-10-CM | POA: Insufficient documentation

## 2022-08-30 DIAGNOSIS — I480 Paroxysmal atrial fibrillation: Secondary | ICD-10-CM | POA: Insufficient documentation

## 2022-08-30 DIAGNOSIS — D759 Disease of blood and blood-forming organs, unspecified: Secondary | ICD-10-CM | POA: Diagnosis not present

## 2022-08-30 HISTORY — PX: ATRIAL FIBRILLATION ABLATION: EP1191

## 2022-08-30 LAB — POCT ACTIVATED CLOTTING TIME: Activated Clotting Time: 374 seconds

## 2022-08-30 SURGERY — ATRIAL FIBRILLATION ABLATION
Anesthesia: General

## 2022-08-30 MED ORDER — PANTOPRAZOLE SODIUM 40 MG PO TBEC
40.0000 mg | DELAYED_RELEASE_TABLET | Freq: Every day | ORAL | 0 refills | Status: DC
  Filled 2022-08-30: qty 30, 30d supply, fill #0

## 2022-08-30 MED ORDER — SODIUM CHLORIDE 0.9 % IV SOLN: INTRAVENOUS | Status: DC

## 2022-08-30 MED ORDER — SODIUM CHLORIDE 0.9 % IV SOLN
Freq: Once | INTRAVENOUS | Status: AC
  Filled 2022-08-30: qty 10

## 2022-08-30 MED ORDER — FENTANYL CITRATE (PF) 250 MCG/5ML IJ SOLN
INTRAMUSCULAR | Status: DC | PRN
  Administered 2022-08-30: 100 ug via INTRAVENOUS

## 2022-08-30 MED ORDER — DOBUTAMINE INFUSION FOR EP/ECHO/NUC (1000 MCG/ML)
INTRAVENOUS | Status: DC | PRN
  Administered 2022-08-30: 20 ug/kg/min via INTRAVENOUS

## 2022-08-30 MED ORDER — LIDOCAINE 2% (20 MG/ML) 5 ML SYRINGE
INTRAMUSCULAR | Status: DC | PRN
  Administered 2022-08-30: 60 mg via INTRAVENOUS

## 2022-08-30 MED ORDER — KETOROLAC TROMETHAMINE 0.5 % OP SOLN
1.0000 [drp] | Freq: Four times a day (QID) | OPHTHALMIC | Status: DC
  Administered 2022-08-30: 1 [drp] via OPHTHALMIC
  Filled 2022-08-30: qty 5

## 2022-08-30 MED ORDER — MIDAZOLAM HCL 2 MG/2ML IJ SOLN
INTRAMUSCULAR | Status: DC | PRN
  Administered 2022-08-30: 2 mg via INTRAVENOUS

## 2022-08-30 MED ORDER — DEXAMETHASONE SODIUM PHOSPHATE 10 MG/ML IJ SOLN
INTRAMUSCULAR | Status: DC | PRN
  Administered 2022-08-30: 10 mg via INTRAVENOUS

## 2022-08-30 MED ORDER — BIVALIRUDIN BOLUS VIA INFUSION - CUPID
INTRAVENOUS | Status: DC | PRN
  Administered 2022-08-30 (×2): 1.75 mg/kg/h via INTRAVENOUS
  Administered 2022-08-30: 81.7 mg via INTRAVENOUS

## 2022-08-30 MED ORDER — SUGAMMADEX SODIUM 200 MG/2ML IV SOLN
INTRAVENOUS | Status: DC | PRN
  Administered 2022-08-30: 200 mg via INTRAVENOUS

## 2022-08-30 MED ORDER — ROCURONIUM BROMIDE 10 MG/ML (PF) SYRINGE
PREFILLED_SYRINGE | INTRAVENOUS | Status: DC | PRN
  Administered 2022-08-30: 20 mg via INTRAVENOUS
  Administered 2022-08-30: 70 mg via INTRAVENOUS
  Administered 2022-08-30: 30 mg via INTRAVENOUS

## 2022-08-30 MED ORDER — PROPOFOL 10 MG/ML IV BOLUS
INTRAVENOUS | Status: DC | PRN
  Administered 2022-08-30: 50 mg via INTRAVENOUS
  Administered 2022-08-30: 150 mg via INTRAVENOUS

## 2022-08-30 MED ORDER — BSS IO SOLN
15.0000 mL | Freq: Once | INTRAOCULAR | Status: DC
  Filled 2022-08-30: qty 15

## 2022-08-30 MED ORDER — PROPOFOL 500 MG/50ML IV EMUL
INTRAVENOUS | Status: DC | PRN
  Administered 2022-08-30 (×3): 150 ug/kg/min via INTRAVENOUS

## 2022-08-30 MED ORDER — ONDANSETRON HCL 4 MG/2ML IJ SOLN
INTRAMUSCULAR | Status: DC | PRN
  Administered 2022-08-30: 4 mg via INTRAVENOUS

## 2022-08-30 MED ORDER — COLCHICINE 0.6 MG PO TABS
0.6000 mg | ORAL_TABLET | Freq: Two times a day (BID) | ORAL | 0 refills | Status: DC
  Filled 2022-08-30: qty 10, 5d supply, fill #0

## 2022-08-30 MED ORDER — DOBUTAMINE INFUSION FOR EP/ECHO/NUC (1000 MCG/ML)
INTRAVENOUS | Status: AC
  Filled 2022-08-30: qty 250

## 2022-08-30 SURGICAL SUPPLY — 21 items
BAG SNAP BAND KOVER 36X36 (MISCELLANEOUS) IMPLANT
BLANKET WARM UNDERBOD FULL ACC (MISCELLANEOUS) ×1 IMPLANT
CATH 8FR REPROCESSED SOUNDSTAR (CATHETERS) ×1 IMPLANT
CATH 8FR SOUNDSTAR REPROCESSED (CATHETERS) IMPLANT
CATH ABLAT QDOT MICRO BI TC DF (CATHETERS) IMPLANT
CATH OCTARAY 2.0 F 3-3-3-3-3 (CATHETERS) IMPLANT
CATH PIGTAIL STEERABLE D1 8.7 (WIRE) IMPLANT
CATH S-M CIRCA TEMP PROBE (CATHETERS) IMPLANT
CATH WEB BI DIR CSDF CRV REPRO (CATHETERS) IMPLANT
CLOSURE PERCLOSE PROSTYLE (VASCULAR PRODUCTS) IMPLANT
COVER SWIFTLINK CONNECTOR (BAG) ×1 IMPLANT
DEVICE CLOSURE MYNXGRIP 6/7F (Vascular Products) IMPLANT
PACK EP LATEX FREE (CUSTOM PROCEDURE TRAY) ×1
PACK EP LF (CUSTOM PROCEDURE TRAY) ×1 IMPLANT
PAD DEFIB RADIO PHYSIO CONN (PAD) ×1 IMPLANT
PATCH CARTO3 (PAD) IMPLANT
SHEATH CARTO VIZIGO MED CURVE (SHEATH) IMPLANT
SHEATH PINNACLE 8F 10CM (SHEATH) IMPLANT
SHEATH PINNACLE 9F 10CM (SHEATH) IMPLANT
SHEATH PROBE COVER 6X72 (BAG) IMPLANT
TUBING SMART ABLATE COOLFLOW (TUBING) IMPLANT

## 2022-08-30 NOTE — Progress Notes (Signed)
Patient c/o L eye pain. Described it as "a big thumb" in his eye; eye is red. Dr. Maple Hudson made aware and to come to bedside.

## 2022-08-30 NOTE — Anesthesia Procedure Notes (Signed)
Procedure Name: Intubation Date/Time: 08/30/2022 8:50 AM  Performed by: Garfield Cornea, CRNAPre-anesthesia Checklist: Patient identified, Emergency Drugs available, Suction available and Patient being monitored Patient Re-evaluated:Patient Re-evaluated prior to induction Oxygen Delivery Method: Circle System Utilized Preoxygenation: Pre-oxygenation with 100% oxygen Induction Type: IV induction Ventilation: Mask ventilation without difficulty and Oral airway inserted - appropriate to patient size Laryngoscope Size: Mac and 4 Grade View: Grade I Tube type: Oral Tube size: 7.5 mm Number of attempts: 1 Airway Equipment and Method: Stylet and Oral airway Placement Confirmation: ETT inserted through vocal cords under direct vision, positive ETCO2 and breath sounds checked- equal and bilateral Secured at: 22 cm Tube secured with: Tape Dental Injury: Teeth and Oropharynx as per pre-operative assessment

## 2022-08-30 NOTE — Anesthesia Preprocedure Evaluation (Addendum)
Anesthesia Evaluation  Patient identified by MRN, date of birth, ID band Patient awake    Reviewed: Allergy & Precautions, NPO status , Patient's Chart, lab work & pertinent test results  History of Anesthesia Complications (+) PONV and history of anesthetic complications  Airway Mallampati: II  TM Distance: >3 FB Neck ROM: Full    Dental  (+) Teeth Intact, Dental Advisory Given   Pulmonary neg pulmonary ROS   breath sounds clear to auscultation       Cardiovascular (-) hypertension(-) angina (-) Past MI + dysrhythmias Atrial Fibrillation  Rhythm:Regular   1. Left ventricular ejection fraction, by estimation, is 65 to 70%. The  left ventricle has normal function. The left ventricle has no regional  wall motion abnormalities. Left ventricular diastolic parameters were  normal.   2. Right ventricular systolic function is normal. The right ventricular  size is normal.   3. The mitral valve is normal in structure. No evidence of mitral valve  regurgitation. No evidence of mitral stenosis.   4. The aortic valve is tricuspid. Aortic valve regurgitation is not  visualized. No aortic stenosis is present.   5. The inferior vena cava is normal in size with greater than 50%  respiratory variability, suggesting right atrial pressure of 3 mmHg.     Neuro/Psych negative neurological ROS     GI/Hepatic negative GI ROS, Neg liver ROS,,,  Endo/Other  negative endocrine ROS    Renal/GU negative Renal ROS     Musculoskeletal negative musculoskeletal ROS (+)    Abdominal   Peds  Hematology  (+) Blood dyscrasia Lab Results      Component                Value               Date                      WBC                      6.2                 08/21/2022                HGB                      15.3                08/21/2022                HCT                      43.7                08/21/2022                MCV                       93                  08/21/2022                PLT                      252                 08/21/2022  eliquis   Anesthesia Other Findings   Reproductive/Obstetrics                             Anesthesia Physical Anesthesia Plan  ASA: 2  Anesthesia Plan: General   Post-op Pain Management: Minimal or no pain anticipated   Induction: Intravenous  PONV Risk Score and Plan: 3 and Propofol infusion, Ondansetron and TIVA  Airway Management Planned: Oral ETT  Additional Equipment: None  Intra-op Plan:   Post-operative Plan: Extubation in OR  Informed Consent: I have reviewed the patients History and Physical, chart, labs and discussed the procedure including the risks, benefits and alternatives for the proposed anesthesia with the patient or authorized representative who has indicated his/her understanding and acceptance.     Dental advisory given  Plan Discussed with: CRNA  Anesthesia Plan Comments:        Anesthesia Quick Evaluation

## 2022-08-30 NOTE — Discharge Instructions (Signed)

## 2022-08-30 NOTE — H&P (Signed)
Electrophysiology Office Note:    Date:  08/30/2022   ID:  [name], DOB July 10, 1955, MRN [id number]  PCP:  Carylon Perches, MD   [facility] Providers Cardiologist:  Charlton Haws, MD     Referring MD: No ref. provider found   History of Present Illness:    [name] is a 67 y.o. male with a hx listed below, significant for alpha-gal, referred for AF management.  He first noticed heart rhythm irregularities at the end of the COVID pandemic.  These occurred very intermittently.  Symptoms included fatigue and palpitations.  He got a new Fitbit watch and July of this past year and it alerted him multiple times to episodes of atrial fibrillation, particular at nighttime.  A Preventice monitor was placed that confirmed the diagnosis of atrial fibrillation.  In our office today, he reports that he felt that he converted to atrial fibrillation in the lobby prior to our visit.  EKG shows atrial fibrillation.  Has a history of alpha gal and sometimes tolerates small amounts of pork.  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  I reviewed the patient's CT and labs. There was no LAA thrombus. he  has not missed any doses of anticoagulation, and he took his dose last night. There have been no changes in the patient's diagnoses, medications, or condition since our recent clinic visit.   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 Jena Gauss: unable to find procedure notes as was in 2003. ?diverticula   COLONOSCOPY N/A 04/21/2020   Procedure: COLONOSCOPY;  Surgeon: Corbin Ade, MD;  Location: [facility];  Service: Endoscopy;  Laterality: N/A;  9:00   LIPOMA EXCISION     POLYPECTOMY  04/21/2020   Procedure: POLYPECTOMY INTESTINAL;  Surgeon: Corbin Ade, MD;  Location: [facility];  Service: Endoscopy;;    Current  Medications: Current Meds  Medication Sig   acetaminophen (TYLENOL) 500 MG tablet Take 500-1,000 mg by mouth every 6 (six) hours as needed (pain/headaches.).   apixaban (ELIQUIS) 5 MG TABS tablet Take 1 tablet (5 mg total) by mouth 2 (two) times daily.   diltiazem (CARDIZEM CD) 120 MG 24 hr capsule Take 120 mg by mouth daily.   EPINEPHrine 0.3 mg/0.3 mL IJ SOAJ injection Inject 0.3 mg into the muscle as needed for anaphylaxis.   fluticasone (FLONASE) 50 MCG/ACT nasal spray Place 1 spray into both nostrils daily as needed for allergies or rhinitis (Seasonal allergies).   Melatonin 10 MG TABS Take 20 mg by mouth at bedtime.     Allergies:   Alpha-gal, Beef-derived products, Pork-derived products, Amoxicillin, and Augmentin [amoxicillin-pot clavulanate]   Social History   Socioeconomic History   Marital status: Married    Spouse name: Not on file   Number of children: Not on file   Years of education: Not on file   Highest education level: Not on file  Occupational History   Not on file  Tobacco Use   Smoking status: Never   Smokeless tobacco: Never  Vaping Use   Vaping Use: Never used  Substance and Sexual Activity   Alcohol use: No   Drug use: No   Sexual activity: Not on file  Other Topics Concern   Not on file  Social History Narrative  Not on file   Social Determinants of Health   Financial Resource Strain: Not on file  Food Insecurity: Not on file  Transportation Needs: Not on file  Physical Activity: Not on file  Stress: Not on file  Social Connections: Not on file     Family History: The patient's family history includes Cancer in his mother. There is no history of Colon cancer or Colon polyps.  ROS:   Please see the history of present illness.    All other systems reviewed and are negative.  EKGs/Labs/Other Studies Reviewed Today:     Monitor 03/06/2022 AF detected TTE: 02/2022 EF 65-70%. Normal LA size.   EKG:  Last EKG results: today -  AF   Recent Labs: 08/21/2022: BUN 16; Creatinine, Ser 0.99; Hemoglobin 15.3; Platelets 252; Potassium 4.7; Sodium 139     Physical Exam:    VS:  BP 131/84   Pulse (!) 55   Temp 98.4 F (36.9 C) (Temporal)   Resp 20   Ht 5\' 11"  (1.803 m)   Wt 108.9 kg   SpO2 100%   BMI 33.47 kg/m     Wt Readings from Last 3 Encounters:  08/30/22 108.9 kg  06/08/22 111.5 kg  02/13/22 114.7 kg     GEN: Well nourished, well developed in no acute distress CARDIAC: RRR, no murmurs, rubs, gallops RESPIRATORY:  Normal work of breathing MUSCULOSKELETAL: no edema    ASSESSMENT & PLAN:    Paroxysmal atrial fibrillation: symptomatic. We discussed management options including ablation and antiarrhythmic medications. We discussed the indication, rationale, logistics, anticipated benefits, and potential risks of the ablation procedure including but not limited to -- bleed at the groin access site, chest pain, damage to nearby organs such as the diaphragm, lungs, or esophagus, need for a drainage tube, or prolonged hospitalization. I explained that the risk for stroke, heart attack, need for open chest surgery, or even death is very low but not zero. he  expressed understanding and wishes to proceed.  Alpha Gal allergy: will use our standard bivalirudin protocol        Medication Adjustments/Labs and Tests Ordered: Current medicines are reviewed at length with the patient today.  Concerns regarding medicines are outlined above.  Orders Placed This Encounter  Procedures   Informed Consent Details: Physician/Practitioner Attestation; Transcribe to consent form and obtain patient signature   Initiate Pre-op Protocol   Void on call to EP Lab   Confirm CBC and BMP (or CMP) results within 7 days for inpatient and 30 days for outpatient:   Clip right and left femoral area PM before surgery   Clip right internal jugular area PM before surgery   Pre-admission testing diagnosis   EP STUDY   Insert  peripheral IV   Meds ordered this encounter  Medications   bivalirudin 10 mg in normal saline 1000 ml irrigation solution   bivalirudin 10 mg in normal saline 1000 ml irrigation solution   bivalirudin 10 mg in normal saline 1000 ml irrigation solution   bivalirudin 10 mg in normal saline 1000 ml irrigation solution   0.9 %  sodium chloride infusion     Signed, Maurice Small, MD  08/30/2022 7:54 AM    [facility]

## 2022-08-30 NOTE — Transfer of Care (Signed)
Immediate Anesthesia Transfer of Care Note  Patient: [name]  Procedure(s) Performed: ATRIAL FIBRILLATION ABLATION  Patient Location: Cath Lab  Anesthesia Type:General  Level of Consciousness: awake, alert , and oriented  Airway & Oxygen Therapy: Patient Spontanous Breathing  Post-op Assessment: Report given to RN and Post -op Vital signs reviewed and stable  Post vital signs: Reviewed and stable  Last Vitals:  Vitals Value Taken Time  BP 109/74 08/30/22 1100  Temp    Pulse 73 08/30/22 1103  Resp 16 08/30/22 1103  SpO2 94 % 08/30/22 1103  Vitals shown include unvalidated device data.  Last Pain:  Vitals:   08/30/22 0656  TempSrc:   PainSc: 0-No pain         Complications: No notable events documented.

## 2022-08-31 ENCOUNTER — Encounter (HOSPITAL_COMMUNITY): Payer: Self-pay | Admitting: Cardiovascular Disease

## 2022-08-31 NOTE — Anesthesia Postprocedure Evaluation (Signed)
Anesthesia Post Note  Patient: [name]  Procedure(s) Performed: ATRIAL FIBRILLATION ABLATION     Patient location during evaluation: Cath Lab Anesthesia Type: General Level of consciousness: awake and alert Pain management: pain level controlled Vital Signs Assessment: post-procedure vital signs reviewed and stable Respiratory status: spontaneous breathing, nonlabored ventilation and respiratory function stable Cardiovascular status: stable Postop Assessment: no apparent nausea or vomiting Anesthetic complications: yes Comments: Corneal Abrasion (OS)   No notable events documented.  Last Vitals:    Last Pain:  Vitals:   08/30/22 1449  TempSrc:   PainSc: 0-No pain                 Bernise Sylvain

## 2022-09-27 ENCOUNTER — Ambulatory Visit (HOSPITAL_COMMUNITY): Payer: 59 | Admitting: Internal Medicine

## 2022-09-29 ENCOUNTER — Ambulatory Visit (HOSPITAL_COMMUNITY)
Admission: RE | Admit: 2022-09-29 | Discharge: 2022-09-29 | Disposition: A | Discharge status: Home / Self Care | Payer: 59 | Source: Ambulatory Visit | Attending: Internal Medicine | Admitting: Internal Medicine

## 2022-09-29 VITALS — BP 126/78 | HR 80 | Ht 71.0 in | Wt 249.0 lb

## 2022-09-29 DIAGNOSIS — Z7901 Long term (current) use of anticoagulants: Secondary | ICD-10-CM | POA: Diagnosis not present

## 2022-09-29 DIAGNOSIS — I480 Paroxysmal atrial fibrillation: Secondary | ICD-10-CM | POA: Diagnosis present

## 2022-09-29 NOTE — Progress Notes (Signed)
Primary Care Physician: Carylon Perches, MD Primary Cardiologist: Charlton Haws, MD Electrophysiologist: Dr. Nelly Laurence     Referring Physician: Dr. Gabriel Rung is a 67 y.o. male with a history of alpha-gal and paroxysmal atrial fibrillation who presents for consultation in the [facility]. He is s/p Afib ablation on 08/30/22 by Dr. Nelly Laurence. Patient is on Eliquis 5 mg BID for a CHADS2VASC score of 1.  On evaluation today, he is currently in NSR. He has not had any episodes of Afib since ablation. He notes he had congestion and was out of work for about a week after the ablation but did not fever or SOB. Since then, he feels well without issue. No chest pain, SOB, or trouble swallowing. Leg sites healed without issue. No missed doses of Eliquis.   Today, he denies symptoms of orthopnea, PND, lower extremity edema, dizziness, presyncope, syncope, snoring, daytime somnolence, bleeding, or neurologic sequela. The patient is tolerating medications without difficulties and is otherwise without complaint today.    he has a BMI of Body mass index is 34.73 kg/m.Marland Kitchen Filed Weights   09/29/22 0944  Weight: 112.9 kg    Current Outpatient Medications  Medication Sig Dispense Refill   acetaminophen (TYLENOL) 500 MG tablet Take 500-1,000 mg by mouth as needed (pain/headaches.).     apixaban (ELIQUIS) 5 MG TABS tablet Take 1 tablet (5 mg total) by mouth 2 (two) times daily. 60 tablet 11   diltiazem (CARDIZEM CD) 120 MG 24 hr capsule Take 120 mg by mouth daily.     EPINEPHrine 0.3 mg/0.3 mL IJ SOAJ injection Inject 0.3 mg into the muscle as needed for anaphylaxis.     fluticasone (FLONASE) 50 MCG/ACT nasal spray Place 1 spray into both nostrils daily as needed for allergies or rhinitis (Seasonal allergies).     Melatonin 10 MG TABS Take 20 mg by mouth at bedtime.     metaxalone (SKELAXIN) 800 MG tablet Take 800 mg by mouth 3 (three) times daily as needed (back  tension/spasm.).     pantoprazole (PROTONIX) 40 MG tablet Take 1 tablet (40 mg total) by mouth daily. 45 tablet 0   triamcinolone cream (KENALOG) 0.1 % Apply 1 Application topically as needed.     No current facility-administered medications for this encounter.    Atrial Fibrillation Management history:  Previous antiarrhythmic drugs: None Previous cardioversions: None Previous ablations: 08/30/22 Anticoagulation history: Eliquis 5 mg BID   ROS- All systems are reviewed and negative except as per the HPI above.  Physical Exam: BP 126/78   Pulse 80   Ht 5\' 11"  (1.803 m)   Wt 112.9 kg   BMI 34.73 kg/m   GEN: Well nourished, well developed in no acute distress NECK: No JVD; No carotid bruits CARDIAC: Regular rate and rhythm, no murmurs, rubs, gallops RESPIRATORY:  Clear to auscultation without rales, wheezing or rhonchi  ABDOMEN: Soft, non-tender, non-distended EXTREMITIES:  No edema; No deformity   EKG today demonstrates  Vent. rate 80 BPM PR interval 126 ms QRS duration 86 ms QT/QTcB 368/424 ms P-R-T axes 37 97 39 Normal sinus rhythm Rightward axis Borderline ECG When compared with ECG of 30-Aug-2022 11:15, PREVIOUS ECG IS PRESENT  Echo 02/14/22 demonstrated:  1. Left ventricular ejection fraction, by estimation, is 65 to 70%. The  left ventricle has normal function. The left ventricle has no regional  wall motion abnormalities. Left ventricular diastolic parameters were  normal.   2. Right  ventricular systolic function is normal. The right ventricular  size is normal.   3. The mitral valve is normal in structure. No evidence of mitral valve  regurgitation. No evidence of mitral stenosis.   4. The aortic valve is tricuspid. Aortic valve regurgitation is not  visualized. No aortic stenosis is present.   5. The inferior vena cava is normal in size with greater than 50%  respiratory variability, suggesting right atrial pressure of 3 mmHg.   Cardiac monitor  11-03/2022: Frequent episodes of PAF/Flutter No ventricular arrhythmias or long pauses   ASSESSMENT & PLAN CHA2DS2-VASc Score = 1  The patient's score is based upon: CHF History: 0 HTN History: 0 Diabetes History: 0 Stroke History: 0 Vascular Disease History: 0 Age Score: 1 Gender Score: 0       ASSESSMENT AND PLAN: Paroxysmal Atrial Fibrillation (ICD10:  I48.0) The patient's CHA2DS2-VASc score is 1, indicating a 0.6% annual risk of stroke.   S/p Afib ablation on 08/30/22 by Dr. Nelly Laurence.  He is currently in NSR. He is doing well overall.  Continue Eliquis without interruption.      Follow up as scheduled with Dr. Nelly Laurence.   Lake Bells, PA-C  [facility] [address] [place], [place] [address] [phone]

## 2022-11-30 ENCOUNTER — Ambulatory Visit: Payer: 59 | Admitting: Cardiovascular Disease

## 2022-12-15 ENCOUNTER — Other Ambulatory Visit (HOSPITAL_COMMUNITY): Payer: Self-pay

## 2022-12-15 ENCOUNTER — Encounter: Payer: Self-pay | Admitting: *Deleted

## 2022-12-15 ENCOUNTER — Other Ambulatory Visit (HOSPITAL_BASED_OUTPATIENT_CLINIC_OR_DEPARTMENT_OTHER): Payer: Self-pay

## 2022-12-15 ENCOUNTER — Ambulatory Visit: Payer: 59 | Attending: Cardiovascular Disease | Admitting: Cardiovascular Disease

## 2022-12-15 VITALS — BP 122/70 | HR 75 | Ht 72.0 in | Wt 243.2 lb

## 2022-12-15 DIAGNOSIS — I480 Paroxysmal atrial fibrillation: Secondary | ICD-10-CM | POA: Diagnosis not present

## 2022-12-15 NOTE — Patient Instructions (Signed)
Medication Instructions:   Stop Eliquis (Apixaban) Continue all other medications.     Labwork:  none  Testing/Procedures:  none  Follow-Up:  1 year   Any Other Special Instructions Will Be Listed Below (If Applicable).   If you need a refill on your cardiac medications before your next appointment, please call your pharmacy.

## 2022-12-15 NOTE — Progress Notes (Signed)
  Electrophysiology Office Note:    Date:  12/15/2022   ID:  [name], DOB 15-Mar-1956, MRN [id number]  PCP:  Brian Perches, MD   [facility] Providers Cardiologist:  Brian Haws, MD     Referring MD: Brian Perches, MD   History of Present Illness:    [name] is a 67 y.o. male with a hx listed below, significant for alpha-gal, referred for AF management.  He first noticed heart rhythm irregularities at the end of the COVID pandemic.  These occurred very intermittently.  Symptoms included fatigue and palpitations.  He got a new Fitbit watch and July of this past year and it alerted him multiple times to episodes of atrial fibrillation, particular at nighttime.  A Preventice monitor was placed that confirmed the diagnosis of atrial fibrillation.  In our office today, he reports that he felt that he converted to atrial fibrillation in the lobby prior to our visit.  EKG shows atrial fibrillation.  Has a history of alpha gal and sometimes tolerates small amounts of pork.  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  He underwent an uncomplicated ablation for AF on Aug 30, 2022. He reports that he has not had any symptoms of AF since.      EKGs/Labs/Other Studies Reviewed Today:     Monitor 03/06/2022 AF detected TTE: 02/2022 EF 65-70%. Normal LA size.   EKG:  Last EKG results: today - AF   Recent Labs: 08/21/2022: BUN 16; Creatinine, Ser 0.99; Hemoglobin 15.3; Platelets 252; Potassium 4.7; Sodium 139     Physical Exam:    VS:  BP 122/70   Pulse 75   Ht 6' (1.829 m)   Wt 243 lb 3.2 oz (110.3 kg)   SpO2 99%   BMI 32.98 kg/m     Wt Readings from Last 3 Encounters:  12/15/22 243 lb 3.2 oz (110.3 kg)  09/29/22 249 lb (112.9 kg)  08/30/22 240 lb (108.9 kg)     GEN: Well nourished, well developed in no acute distress CARDIAC: RRR, no murmurs, rubs, gallops RESPIRATORY:  Normal work of breathing MUSCULOSKELETAL: no edema    ASSESSMENT & PLAN:    Paroxysmal  atrial fibrillation:  symptomatic.  S/p AF ablation in May, 2024.  Secondary hypercoagulable state CHA2DS2-VASc score is 1 Will discontinue apixaban today  Alpha Gal allergy:         Medication Adjustments/Labs and Tests Ordered: Current medicines are reviewed at length with the patient today.  Concerns regarding medicines are outlined above.  Orders Placed This Encounter  Procedures   EKG 12-Lead   No orders of the defined types were placed in this encounter.    Signed, Maurice Small, MD  12/15/2022 1:25 PM    [facility]

## 2022-12-21 ENCOUNTER — Encounter: Payer: Self-pay | Admitting: Cardiovascular Disease

## 2023-02-12 ENCOUNTER — Emergency Department (HOSPITAL_COMMUNITY)
Admission: EM | Admit: 2023-02-12 | Discharge: 2023-02-12 | Disposition: A | Discharge status: Home / Self Care | Payer: 59 | Attending: Emergency Medicine | Admitting: Emergency Medicine

## 2023-02-12 ENCOUNTER — Encounter (HOSPITAL_COMMUNITY): Payer: Self-pay

## 2023-02-12 ENCOUNTER — Emergency Department (HOSPITAL_COMMUNITY): Payer: 59

## 2023-02-12 ENCOUNTER — Other Ambulatory Visit: Payer: Self-pay

## 2023-02-12 DIAGNOSIS — S0181XA Laceration without foreign body of other part of head, initial encounter: Secondary | ICD-10-CM | POA: Diagnosis not present

## 2023-02-12 DIAGNOSIS — S0990XA Unspecified injury of head, initial encounter: Secondary | ICD-10-CM | POA: Diagnosis present

## 2023-02-12 DIAGNOSIS — W228XXA Striking against or struck by other objects, initial encounter: Secondary | ICD-10-CM | POA: Diagnosis not present

## 2023-02-12 DIAGNOSIS — Y990 Civilian activity done for income or pay: Secondary | ICD-10-CM | POA: Insufficient documentation
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
## 2023-02-12 NOTE — ED Notes (Signed)
Small lac on forehead irrigated, cleaned, and covered.

## 2023-02-12 NOTE — Discharge Instructions (Signed)
Take Tylenol or Motrin for pain and follow-up with your doctor if any problems °
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
## 2023-12-14 ENCOUNTER — Ambulatory Visit: Payer: 59 | Attending: Cardiovascular Disease | Admitting: Cardiovascular Disease

## 2023-12-14 ENCOUNTER — Encounter: Payer: Self-pay | Admitting: Cardiovascular Disease

## 2023-12-14 VITALS — BP 120/74 | HR 75 | Ht 71.5 in | Wt 254.0 lb

## 2023-12-14 DIAGNOSIS — I480 Paroxysmal atrial fibrillation: Secondary | ICD-10-CM

## 2023-12-14 DIAGNOSIS — I4891 Unspecified atrial fibrillation: Secondary | ICD-10-CM

## 2023-12-14 NOTE — Patient Instructions (Addendum)

## 2023-12-14 NOTE — Progress Notes (Signed)
  Electrophysiology Office Note:    Date:  12/14/2023   ID:  [name], DOB 1955-09-16, MRN [id number]  PCP:  Sheryle Carwin, MD   [facility] Providers Cardiologist:  Maude Emmer, MD Electrophysiologist:  Eulas FORBES Furbish, MD     Referring MD: Sheryle Carwin, MD   History of Present Illness:    [name] is a 68 y.o. male with a hx listed below, significant for alpha-gal, referred for AF management.  He first noticed heart rhythm irregularities at the end of the COVID pandemic.  These occurred very intermittently.  Symptoms included fatigue and palpitations.  He got a new Fitbit watch and July of this past year and it alerted him multiple times to episodes of atrial fibrillation, particular at nighttime.  A [employer] monitor was placed that confirmed the diagnosis of atrial fibrillation.  In our office today, he reports that he felt that he converted to atrial fibrillation in the lobby prior to our visit.  EKG shows atrial fibrillation.  Has a history of alpha gal and sometimes tolerates small amounts of pork.  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  He underwent an uncomplicated ablation for AF on Aug 30, 2022. He reports that he has not had any symptoms of AF since.  He will pursue a Fitbit heart rhythm monitor, and his last A-fib notification was 3 days prior to the A-fib ablation.     EKGs/Labs/Other Studies Reviewed Today:     Monitor 03/06/2022 AF detected TTE: 02/2022 EF 65-70%. Normal LA size.   EKG:  Last EKG results: today - AF   Recent Labs: No results found for requested labs within last 365 days.     Physical Exam:    VS:  BP 120/74   Pulse 75   Ht 5' 11.5 (1.816 m)   Wt 254 lb (115.2 kg)   SpO2 99%   BMI 34.93 kg/m     Wt Readings from Last 3 Encounters:  12/14/23 254 lb (115.2 kg)  02/12/23 235 lb (106.6 kg)  12/15/22 243 lb 3.2 oz (110.3 kg)     GEN: Well nourished, well developed in no acute distress CARDIAC: RRR, no murmurs,  rubs, gallops RESPIRATORY:  Normal work of breathing MUSCULOSKELETAL: no edema    ASSESSMENT & PLAN:    Paroxysmal atrial fibrillation:  symptomatic.  S/p AF ablation in May, 2024. Maintaining sinus rhythm Monitoring with Fitbit device  Secondary hypercoagulable state CHA2DS2-VASc score is 1 Off anticoagulation  Alpha Gal allergy:         Medication Adjustments/Labs and Tests Ordered: Current medicines are reviewed at length with the patient today.  Concerns regarding medicines are outlined above.  Orders Placed This Encounter  Procedures   EKG 12-Lead   No orders of the defined types were placed in this encounter.    Signed, Eulas FORBES Furbish, MD  12/14/2023 1:18 PM    [facility]
